# Patient Record
Sex: Female | Born: 1970 | Race: White | Hispanic: No | Marital: Married | Smoking: Never smoker
Health system: Southern US, Community
[De-identification: ages and names within clinical notes are randomized; demographics above are authoritative.]

## PROBLEM LIST (undated history)

## (undated) DIAGNOSIS — F419 Anxiety disorder, unspecified: Secondary | ICD-10-CM

## (undated) DIAGNOSIS — E785 Hyperlipidemia, unspecified: Secondary | ICD-10-CM

## (undated) DIAGNOSIS — T7840XA Allergy, unspecified, initial encounter: Secondary | ICD-10-CM

## (undated) DIAGNOSIS — Z789 Other specified health status: Secondary | ICD-10-CM

## (undated) HISTORY — PX: DILATION AND CURETTAGE OF UTERUS: SHX78

## (undated) HISTORY — DX: Hyperlipidemia, unspecified: E78.5

## (undated) HISTORY — DX: Anxiety disorder, unspecified: F41.9

## (undated) HISTORY — DX: Allergy, unspecified, initial encounter: T78.40XA

## (undated) HISTORY — PX: BREAST BIOPSY: SHX20

---

## 2006-09-09 HISTORY — PX: WRIST GANGLION EXCISION: SHX840

## 2009-05-19 ENCOUNTER — Inpatient Hospital Stay (HOSPITAL_COMMUNITY): Admission: RE | Admit: 2009-05-19 | Discharge: 2009-05-22 | Payer: Self-pay | Admitting: Obstetrics and Gynecology

## 2010-09-15 ENCOUNTER — Ambulatory Visit (HOSPITAL_COMMUNITY)
Admission: RE | Admit: 2010-09-15 | Discharge: 2010-09-15 | Payer: Self-pay | Source: Home / Self Care | Attending: Obstetrics and Gynecology | Admitting: Obstetrics and Gynecology

## 2010-09-24 LAB — CBC
HCT: 39.7 % (ref 36.0–46.0)
Hemoglobin: 13.7 g/dL (ref 12.0–15.0)
MCH: 32.5 pg (ref 26.0–34.0)
MCHC: 34.5 g/dL (ref 30.0–36.0)
MCV: 94.1 fL (ref 78.0–100.0)
Platelets: 248 10*3/uL (ref 150–400)
RBC: 4.22 MIL/uL (ref 3.87–5.11)
RDW: 13 % (ref 11.5–15.5)
WBC: 5.2 10*3/uL (ref 4.0–10.5)

## 2010-10-10 DEATH — deceased

## 2010-12-14 LAB — CBC
HCT: 30.5 % — ABNORMAL LOW (ref 36.0–46.0)
HCT: 39.2 % (ref 36.0–46.0)
Hemoglobin: 10.5 g/dL — ABNORMAL LOW (ref 12.0–15.0)
Hemoglobin: 13.4 g/dL (ref 12.0–15.0)
MCHC: 34.3 g/dL (ref 30.0–36.0)
MCHC: 34.5 g/dL (ref 30.0–36.0)
MCV: 100.5 fL — ABNORMAL HIGH (ref 78.0–100.0)
MCV: 99.5 fL (ref 78.0–100.0)
Platelets: 166 10*3/uL (ref 150–400)
Platelets: 198 10*3/uL (ref 150–400)
RBC: 3.03 MIL/uL — ABNORMAL LOW (ref 3.87–5.11)
RBC: 3.94 MIL/uL (ref 3.87–5.11)
RDW: 13.6 % (ref 11.5–15.5)
RDW: 14.3 % (ref 11.5–15.5)
WBC: 10.2 10*3/uL (ref 4.0–10.5)
WBC: 8.1 10*3/uL (ref 4.0–10.5)

## 2010-12-14 LAB — RPR: RPR Ser Ql: NONREACTIVE

## 2012-07-14 LAB — OB RESULTS CONSOLE ABO/RH: RH Type: POSITIVE

## 2012-07-14 LAB — OB RESULTS CONSOLE HEPATITIS B SURFACE ANTIGEN: Hepatitis B Surface Ag: NEGATIVE

## 2012-07-14 LAB — OB RESULTS CONSOLE RUBELLA ANTIBODY, IGM: Rubella: IMMUNE

## 2012-07-14 LAB — OB RESULTS CONSOLE ANTIBODY SCREEN: Antibody Screen: NEGATIVE

## 2012-07-14 LAB — OB RESULTS CONSOLE HIV ANTIBODY (ROUTINE TESTING): HIV: NONREACTIVE

## 2012-09-09 HISTORY — PX: TUBAL LIGATION: SHX77

## 2013-01-29 ENCOUNTER — Encounter (HOSPITAL_COMMUNITY): Payer: Self-pay

## 2013-02-08 NOTE — H&P (Addendum)
Amanda Bush is a 42 y.o. female presenting for repeat C/S and BTL  Previous C/S desires repeat.  Pregnancy complicated by AMA with normal [place] and MateriT21 . History OB History   No data available     No past medical history on file. No past surgical history on file. Family History: family history is not on file. Social History:  has no tobacco, alcohol, and drug history on file.   Prenatal Transfer Tool  Maternal Diabetes: No Genetic Screening: Normal Maternal Ultrasounds/Referrals: Normal Fetal Ultrasounds or other Referrals:  None Maternal Substance Abuse:  No Significant Maternal Medications:  None Significant Maternal Lab Results:  None Other Comments:  None  ROS    There were no vitals taken for this visit. Exam Physical Exam  Cx Cl /50%/High Prenatal labs: ABO, Rh:   Antibody:   Rubella:   RPR:    HBsAg:    HIV:    GBS:     Assessment/Plan: IUP at 39 weeks Prev C/S desires repeat Risks and benefits of C/S were discussed.  All questions were answered and informed consent was obtained.  Plan to proceed with low segment transverse Cesarean Section. Multiparity and desires sterility.  Discussed risks and benefits of sterilization including but not limited to risk of tubal failure quoted as 01/999.  She gives her informed consent.   Amanda Bush C 02/08/2013, 9:39 AM    This patient has been seen and examined.   All of her questions were answered.  Labs and vital signs reviewed.  Informed consent has been obtained.  The History and Physical is current. 02/11/13 0715 DL

## 2013-02-09 ENCOUNTER — Encounter (HOSPITAL_COMMUNITY)
Admission: RE | Admit: 2013-02-09 | Discharge: 2013-02-09 | Disposition: A | Discharge status: Home / Self Care | Payer: Managed Care, Other (non HMO) | Source: Ambulatory Visit | Attending: Obstetrics and Gynecology | Admitting: Obstetrics and Gynecology

## 2013-02-09 ENCOUNTER — Encounter (HOSPITAL_COMMUNITY): Payer: Self-pay

## 2013-02-09 HISTORY — DX: Other specified health status: Z78.9

## 2013-02-09 LAB — CBC
HCT: 39.8 % (ref 36.0–46.0)
Hemoglobin: 13.5 g/dL (ref 12.0–15.0)
MCH: 33.3 pg (ref 26.0–34.0)
MCHC: 33.9 g/dL (ref 30.0–36.0)
MCV: 98 fL (ref 78.0–100.0)
Platelets: 164 10*3/uL (ref 150–400)
RBC: 4.06 MIL/uL (ref 3.87–5.11)
RDW: 14.6 % (ref 11.5–15.5)
WBC: 8.1 10*3/uL (ref 4.0–10.5)

## 2013-02-09 LAB — TYPE AND SCREEN
ABO/RH(D): A POS
Antibody Screen: NEGATIVE

## 2013-02-09 LAB — ABO/RH: ABO/RH(D): A POS

## 2013-02-09 NOTE — Patient Instructions (Addendum)
20 SUHANI STILLION  02/09/2013   Your procedure is scheduled on:  02/11/13  Enter through the Main Entrance of [facility] at 6 AM.  Pick up the phone at the desk and dial [phone].   Call this number if you have problems the morning of surgery: [phone]   Remember:   Do not eat food:After Midnight.  Do not drink clear liquids: After Midnight.  Take these medicines the morning of surgery with A SIP OF WATER: NA   Do not wear jewelry, make-up or nail polish.  Do not wear lotions, powders, or perfumes. You may wear deodorant.  Do not shave 48 hours prior to surgery.  Do not bring valuables to the hospital.  [facility] is not responsible                  for any belongings or valuables brought to the hospital.  Contacts, dentures or bridgework may not be worn into surgery.  Leave suitcase in the car. After surgery it may be brought to your room.  For patients admitted to the hospital, checkout time is 11:00 AM the day of                discharge.   Patients discharged the day of surgery will not be allowed to drive                   home.  Name and phone number of your driver: NA  Special Instructions: Shower using CHG 2 nights before surgery and the night before surgery.  If you shower the day of surgery use CHG.  Use special wash - you have one bottle of CHG for all showers.  You should use approximately 1/3 of the bottle for each shower.   Please read over the following fact sheets that you were given: Surgical Site Infection Prevention

## 2013-02-10 LAB — RPR: RPR Ser Ql: NONREACTIVE

## 2013-02-10 MED ORDER — DEXTROSE 5 % IV SOLN
2.0000 g | INTRAVENOUS | Status: AC
  Administered 2013-02-11: 2 g via INTRAVENOUS
  Filled 2013-02-10: qty 2

## 2013-02-11 ENCOUNTER — Encounter (HOSPITAL_COMMUNITY): Payer: Self-pay | Admitting: *Deleted

## 2013-02-11 ENCOUNTER — Inpatient Hospital Stay (HOSPITAL_COMMUNITY): Payer: Managed Care, Other (non HMO) | Admitting: Anesthesiology

## 2013-02-11 ENCOUNTER — Encounter (HOSPITAL_COMMUNITY): Payer: Self-pay | Admitting: Anesthesiology

## 2013-02-11 ENCOUNTER — Encounter (HOSPITAL_COMMUNITY)
Admission: RE | Disposition: A | Discharge status: Home / Self Care | Payer: Self-pay | Source: Ambulatory Visit | Attending: Obstetrics and Gynecology

## 2013-02-11 ENCOUNTER — Inpatient Hospital Stay (HOSPITAL_COMMUNITY)
Admission: RE | Admit: 2013-02-11 | Discharge: 2013-02-13 | DRG: 766 | Disposition: A | Discharge status: Home / Self Care | Payer: Managed Care, Other (non HMO) | Source: Ambulatory Visit | Attending: Obstetrics and Gynecology | Admitting: Obstetrics and Gynecology

## 2013-02-11 DIAGNOSIS — Z302 Encounter for sterilization: Secondary | ICD-10-CM

## 2013-02-11 DIAGNOSIS — O34219 Maternal care for unspecified type scar from previous cesarean delivery: Principal | ICD-10-CM | POA: Diagnosis present

## 2013-02-11 DIAGNOSIS — O09529 Supervision of elderly multigravida, unspecified trimester: Secondary | ICD-10-CM | POA: Diagnosis present

## 2013-02-11 LAB — TYPE AND SCREEN: ABO/RH(D): A POS

## 2013-02-11 SURGERY — Surgical Case
Anesthesia: Spinal | Laterality: Bilateral | Wound class: Clean Contaminated

## 2013-02-11 MED ORDER — MENTHOL 3 MG MT LOZG: 1.0000 | LOZENGE | OROMUCOSAL | Status: DC | PRN

## 2013-02-11 MED ORDER — DIPHENHYDRAMINE HCL 25 MG PO CAPS: 25.0000 mg | ORAL_CAPSULE | Freq: Four times a day (QID) | ORAL | Status: DC | PRN

## 2013-02-11 MED ORDER — FENTANYL CITRATE 0.05 MG/ML IJ SOLN
INTRAMUSCULAR | Status: AC
  Filled 2013-02-11: qty 2

## 2013-02-11 MED ORDER — MEPERIDINE HCL 25 MG/ML IJ SOLN: 6.2500 mg | INTRAMUSCULAR | Status: DC | PRN

## 2013-02-11 MED ORDER — LANOLIN HYDROUS EX OINT: 1.0000 "application " | TOPICAL_OINTMENT | CUTANEOUS | Status: DC | PRN

## 2013-02-11 MED ORDER — LACTATED RINGERS IV SOLN
Freq: Once | INTRAVENOUS | Status: AC
  Administered 2013-02-11: 1000 mL via INTRAVENOUS

## 2013-02-11 MED ORDER — DIBUCAINE 1 % RE OINT: 1.0000 "application " | TOPICAL_OINTMENT | RECTAL | Status: DC | PRN

## 2013-02-11 MED ORDER — SENNOSIDES-DOCUSATE SODIUM 8.6-50 MG PO TABS
2.0000 | ORAL_TABLET | Freq: Every day | ORAL | Status: DC
  Administered 2013-02-11 – 2013-02-12 (×2): 2 via ORAL

## 2013-02-11 MED ORDER — METOCLOPRAMIDE HCL 5 MG/ML IJ SOLN: 10.0000 mg | Freq: Three times a day (TID) | INTRAMUSCULAR | Status: DC | PRN

## 2013-02-11 MED ORDER — NALBUPHINE HCL 10 MG/ML IJ SOLN
5.0000 mg | INTRAMUSCULAR | Status: DC | PRN
  Filled 2013-02-11: qty 1

## 2013-02-11 MED ORDER — EPHEDRINE SULFATE 50 MG/ML IJ SOLN
INTRAMUSCULAR | Status: DC | PRN
  Administered 2013-02-11 (×3): 10 mg via INTRAVENOUS

## 2013-02-11 MED ORDER — SCOPOLAMINE 1 MG/3DAYS TD PT72
MEDICATED_PATCH | TRANSDERMAL | Status: AC
  Filled 2013-02-11: qty 1

## 2013-02-11 MED ORDER — DIPHENHYDRAMINE HCL 50 MG/ML IJ SOLN: 12.5000 mg | INTRAMUSCULAR | Status: DC | PRN

## 2013-02-11 MED ORDER — OXYCODONE-ACETAMINOPHEN 5-325 MG PO TABS
1.0000 | ORAL_TABLET | ORAL | Status: DC | PRN
  Administered 2013-02-12: 0.5 via ORAL
  Filled 2013-02-11 (×2): qty 1

## 2013-02-11 MED ORDER — LACTATED RINGERS IV SOLN
INTRAVENOUS | Status: DC | PRN
  Administered 2013-02-11: 08:00:00 via INTRAVENOUS

## 2013-02-11 MED ORDER — NALOXONE HCL 0.4 MG/ML IJ SOLN: 0.4000 mg | INTRAMUSCULAR | Status: DC | PRN

## 2013-02-11 MED ORDER — PRENATAL MULTIVITAMIN CH
1.0000 | ORAL_TABLET | Freq: Every day | ORAL | Status: DC
  Administered 2013-02-11 – 2013-02-12 (×2): 1 via ORAL
  Filled 2013-02-11 (×2): qty 1

## 2013-02-11 MED ORDER — ONDANSETRON HCL 4 MG/2ML IJ SOLN
INTRAMUSCULAR | Status: DC | PRN
  Administered 2013-02-11: 4 mg via INTRAVENOUS

## 2013-02-11 MED ORDER — NALOXONE HCL 1 MG/ML IJ SOLN
1.0000 ug/kg/h | INTRAVENOUS | Status: DC | PRN
  Filled 2013-02-11: qty 2

## 2013-02-11 MED ORDER — BUPIVACAINE IN DEXTROSE 0.75-8.25 % IT SOLN
INTRATHECAL | Status: DC | PRN
  Administered 2013-02-11: 1.5 mL via INTRATHECAL

## 2013-02-11 MED ORDER — FENTANYL CITRATE 0.05 MG/ML IJ SOLN: 25.0000 ug | INTRAMUSCULAR | Status: DC | PRN

## 2013-02-11 MED ORDER — SODIUM CHLORIDE 0.9 % IJ SOLN: 3.0000 mL | INTRAMUSCULAR | Status: DC | PRN

## 2013-02-11 MED ORDER — KETOROLAC TROMETHAMINE 30 MG/ML IJ SOLN
30.0000 mg | Freq: Four times a day (QID) | INTRAMUSCULAR | Status: AC | PRN
  Administered 2013-02-11: 30 mg via INTRAVENOUS
  Filled 2013-02-11: qty 1

## 2013-02-11 MED ORDER — LACTATED RINGERS IV SOLN: INTRAVENOUS | Status: DC

## 2013-02-11 MED ORDER — ONDANSETRON HCL 4 MG PO TABS: 4.0000 mg | ORAL_TABLET | ORAL | Status: DC | PRN

## 2013-02-11 MED ORDER — LACTATED RINGERS IV SOLN
INTRAVENOUS | Status: DC
  Administered 2013-02-11: 14:00:00 via INTRAVENOUS

## 2013-02-11 MED ORDER — KETOROLAC TROMETHAMINE 30 MG/ML IJ SOLN: 30.0000 mg | Freq: Four times a day (QID) | INTRAMUSCULAR | Status: AC | PRN

## 2013-02-11 MED ORDER — EPHEDRINE 5 MG/ML INJ
INTRAVENOUS | Status: AC
  Filled 2013-02-11: qty 10

## 2013-02-11 MED ORDER — SCOPOLAMINE 1 MG/3DAYS TD PT72
1.0000 | MEDICATED_PATCH | Freq: Once | TRANSDERMAL | Status: DC
  Administered 2013-02-11: 1.5 mg via TRANSDERMAL

## 2013-02-11 MED ORDER — LACTATED RINGERS IV SOLN
INTRAVENOUS | Status: DC | PRN
  Administered 2013-02-11 (×2): via INTRAVENOUS

## 2013-02-11 MED ORDER — ONDANSETRON HCL 4 MG/2ML IJ SOLN
INTRAMUSCULAR | Status: AC
  Filled 2013-02-11: qty 2

## 2013-02-11 MED ORDER — ONDANSETRON HCL 4 MG/2ML IJ SOLN: 4.0000 mg | INTRAMUSCULAR | Status: DC | PRN

## 2013-02-11 MED ORDER — SCOPOLAMINE 1 MG/3DAYS TD PT72: 1.0000 | MEDICATED_PATCH | Freq: Once | TRANSDERMAL | Status: DC

## 2013-02-11 MED ORDER — PROMETHAZINE HCL 25 MG/ML IJ SOLN: 6.2500 mg | INTRAMUSCULAR | Status: DC | PRN

## 2013-02-11 MED ORDER — DIPHENHYDRAMINE HCL 25 MG PO CAPS
25.0000 mg | ORAL_CAPSULE | ORAL | Status: DC | PRN
  Filled 2013-02-11: qty 1

## 2013-02-11 MED ORDER — OXYTOCIN 10 UNIT/ML IJ SOLN
INTRAMUSCULAR | Status: AC
  Filled 2013-02-11: qty 4

## 2013-02-11 MED ORDER — PHENYLEPHRINE HCL 10 MG/ML IJ SOLN
INTRAMUSCULAR | Status: DC | PRN
  Administered 2013-02-11: 80 ug via INTRAVENOUS

## 2013-02-11 MED ORDER — OXYTOCIN 10 UNIT/ML IJ SOLN
40.0000 [IU] | INTRAVENOUS | Status: DC | PRN
  Administered 2013-02-11: 40 [IU] via INTRAVENOUS

## 2013-02-11 MED ORDER — OXYTOCIN 40 UNITS IN LACTATED RINGERS INFUSION - SIMPLE MED: 62.5000 mL/h | INTRAVENOUS | Status: AC

## 2013-02-11 MED ORDER — DIPHENHYDRAMINE HCL 50 MG/ML IJ SOLN: 25.0000 mg | INTRAMUSCULAR | Status: DC | PRN

## 2013-02-11 MED ORDER — WITCH HAZEL-GLYCERIN EX PADS: 1.0000 "application " | MEDICATED_PAD | CUTANEOUS | Status: DC | PRN

## 2013-02-11 MED ORDER — SIMETHICONE 80 MG PO CHEW: 80.0000 mg | CHEWABLE_TABLET | ORAL | Status: DC | PRN

## 2013-02-11 MED ORDER — KETOROLAC TROMETHAMINE 60 MG/2ML IM SOLN
INTRAMUSCULAR | Status: AC
  Administered 2013-02-11: 60 mg via INTRAMUSCULAR
  Filled 2013-02-11: qty 2

## 2013-02-11 MED ORDER — FENTANYL CITRATE 0.05 MG/ML IJ SOLN
INTRAMUSCULAR | Status: DC | PRN
  Administered 2013-02-11: 25 ug via INTRATHECAL

## 2013-02-11 MED ORDER — MORPHINE SULFATE 0.5 MG/ML IJ SOLN
INTRAMUSCULAR | Status: AC
  Filled 2013-02-11: qty 10

## 2013-02-11 MED ORDER — KETOROLAC TROMETHAMINE 60 MG/2ML IM SOLN: 60.0000 mg | Freq: Once | INTRAMUSCULAR | Status: AC | PRN

## 2013-02-11 MED ORDER — PHENYLEPHRINE 40 MCG/ML (10ML) SYRINGE FOR IV PUSH (FOR BLOOD PRESSURE SUPPORT)
PREFILLED_SYRINGE | INTRAVENOUS | Status: AC
  Filled 2013-02-11: qty 5

## 2013-02-11 MED ORDER — TETANUS-DIPHTH-ACELL PERTUSSIS 5-2.5-18.5 LF-MCG/0.5 IM SUSP: 0.5000 mL | Freq: Once | INTRAMUSCULAR | Status: DC

## 2013-02-11 MED ORDER — ZOLPIDEM TARTRATE 5 MG PO TABS: 5.0000 mg | ORAL_TABLET | Freq: Every evening | ORAL | Status: DC | PRN

## 2013-02-11 MED ORDER — IBUPROFEN 600 MG PO TABS
600.0000 mg | ORAL_TABLET | Freq: Four times a day (QID) | ORAL | Status: DC
  Administered 2013-02-11 – 2013-02-13 (×6): 600 mg via ORAL
  Filled 2013-02-11 (×6): qty 1

## 2013-02-11 MED ORDER — MORPHINE SULFATE (PF) 0.5 MG/ML IJ SOLN
INTRAMUSCULAR | Status: DC | PRN
  Administered 2013-02-11: .15 mg via INTRATHECAL

## 2013-02-11 MED ORDER — SIMETHICONE 80 MG PO CHEW
80.0000 mg | CHEWABLE_TABLET | Freq: Three times a day (TID) | ORAL | Status: DC
  Administered 2013-02-11 – 2013-02-12 (×6): 80 mg via ORAL

## 2013-02-11 MED ORDER — ONDANSETRON HCL 4 MG/2ML IJ SOLN: 4.0000 mg | Freq: Three times a day (TID) | INTRAMUSCULAR | Status: DC | PRN

## 2013-02-11 SURGICAL SUPPLY — 26 items
CLAMP CORD UMBIL (MISCELLANEOUS) IMPLANT
CLIP FILSHIE TUBAL LIGA STRL (Clip) ×2 IMPLANT
CLOTH BEACON ORANGE TIMEOUT ST (SAFETY) ×2 IMPLANT
DRAPE LG THREE QUARTER DISP (DRAPES) ×2 IMPLANT
DRSG OPSITE POSTOP 4X10 (GAUZE/BANDAGES/DRESSINGS) ×2 IMPLANT
DURAPREP 26ML APPLICATOR (WOUND CARE) ×2 IMPLANT
ELECT REM PT RETURN 9FT ADLT (ELECTROSURGICAL) ×2
ELECTRODE REM PT RTRN 9FT ADLT (ELECTROSURGICAL) ×1 IMPLANT
EXTRACTOR VACUUM M CUP 4 TUBE (SUCTIONS) IMPLANT
GLOVE SURG ORTHO 8.0 STRL STRW (GLOVE) ×2 IMPLANT
GOWN STRL REIN XL XLG (GOWN DISPOSABLE) ×4 IMPLANT
KIT ABG SYR 3ML LUER SLIP (SYRINGE) ×2 IMPLANT
NEEDLE HYPO 25X5/8 SAFETYGLIDE (NEEDLE) ×2 IMPLANT
NS IRRIG 1000ML POUR BTL (IV SOLUTION) ×2 IMPLANT
PACK C SECTION WH (CUSTOM PROCEDURE TRAY) ×2 IMPLANT
PAD OB MATERNITY 4.3X12.25 (PERSONAL CARE ITEMS) ×2 IMPLANT
STAPLER VISISTAT 35W (STAPLE) IMPLANT
SUT MNCRL 0 VIOLET CTX 36 (SUTURE) ×3 IMPLANT
SUT MONOCRYL 0 CTX 36 (SUTURE) ×3
SUT PDS AB 1 CT  36 (SUTURE)
SUT PDS AB 1 CT 36 (SUTURE) IMPLANT
SUT VIC AB 1 CTX 36 (SUTURE)
SUT VIC AB 1 CTX36XBRD ANBCTRL (SUTURE) IMPLANT
TOWEL OR 17X24 6PK STRL BLUE (TOWEL DISPOSABLE) ×6 IMPLANT
TRAY FOLEY CATH 14FR (SET/KITS/TRAYS/PACK) ×2 IMPLANT
WATER STERILE IRR 1000ML POUR (IV SOLUTION) ×2 IMPLANT

## 2013-02-11 NOTE — Transfer of Care (Signed)
Immediate Anesthesia Transfer of Care Note  Patient: Amanda Bush  Procedure(s) Performed: Procedure(s): REPEAT CESAREAN SECTION WITH BILATERAL TUBAL LIGATION (Bilateral)  Patient Location: PACU  Anesthesia Type:Spinal  Level of Consciousness: awake, alert  and oriented  Airway & Oxygen Therapy: Patient Spontanous Breathing  Post-op Assessment: Report given to PACU RN and Post -op Vital signs reviewed and stable  Post vital signs: Reviewed and stable  Complications: No apparent anesthesia complications

## 2013-02-11 NOTE — Anesthesia Postprocedure Evaluation (Signed)
  Anesthesia Post-op Note  Patient: Amanda Bush  Procedure(s) Performed: Procedure(s): REPEAT CESAREAN SECTION WITH BILATERAL TUBAL LIGATION (Bilateral)  Patient Location: PACU and Mother/Baby  Anesthesia Type:Spinal  Level of Consciousness: awake, alert , oriented and patient cooperative  Airway and Oxygen Therapy: Patient Spontanous Breathing  Post-op Pain: none  Post-op Assessment: Post-op Vital signs reviewed, No signs of Nausea or vomiting, Adequate PO intake, Pain level controlled, No headache, No backache, No residual numbness and No residual motor weakness  Post-op Vital Signs: Reviewed and stable  Complications: No apparent anesthesia complications

## 2013-02-11 NOTE — Anesthesia Postprocedure Evaluation (Signed)
  Anesthesia Post Note  Patient: Amanda Bush  Procedure(s) Performed: Procedure(s) (LRB): REPEAT CESAREAN SECTION WITH BILATERAL TUBAL LIGATION (Bilateral)  Anesthesia type: Spinal  Patient location: PACU  Post pain: Pain level controlled  Post assessment: Post-op Vital signs reviewed  Last Vitals:  Filed Vitals:   02/11/13 0945  BP: 113/69  Pulse: 71  Temp:   Resp: 17    Post vital signs: Reviewed  Level of consciousness: awake  Complications: No apparent anesthesia complications

## 2013-02-11 NOTE — Anesthesia Procedure Notes (Signed)
Spinal  Patient location during procedure: OR Start time: 02/11/2013 7:37 AM Staffing Anesthesiologist: Angus Seller., Harrell Gave. Performed by: anesthesiologist  Preanesthetic Checklist Completed: patient identified, site marked, surgical consent, pre-op evaluation, timeout performed, IV checked, risks and benefits discussed and monitors and equipment checked Spinal Block Patient position: sitting Prep: DuraPrep Patient monitoring: heart rate, cardiac monitor, continuous pulse ox and blood pressure Approach: midline Location: L3-4 Injection technique: single-shot Needle Needle type: Sprotte  Needle gauge: 24 G Needle length: 9 cm Assessment Sensory level: T4 Additional Notes Patient identified.  Risk benefits discussed including failed block, incomplete pain control, headache, nerve damage, paralysis, blood pressure changes, nausea, vomiting, reactions to medication both toxic or allergic, and postpartum back pain.  Patient expressed understanding and wished to proceed.  All questions were answered.  Sterile technique used throughout procedure.  CSF was clear.  No parasthesia or other complications.  Please see nursing notes for vital signs.

## 2013-02-11 NOTE — Anesthesia Preprocedure Evaluation (Addendum)
Anesthesia Evaluation  Patient identified by MRN, date of birth, ID band Patient awake    Reviewed: Allergy & Precautions, H&P , NPO status , Patient's Chart, lab work & pertinent test results, reviewed documented beta blocker date and time   Airway Mallampati: II TM Distance: >3 FB Neck ROM: full    Dental no notable dental hx.    Pulmonary neg pulmonary ROS,  breath sounds clear to auscultation  Pulmonary exam normal       Cardiovascular Exercise Tolerance: Good negative cardio ROS  Rhythm:regular Rate:Normal     Neuro/Psych negative neurological ROS  negative psych ROS   GI/Hepatic negative GI ROS, Neg liver ROS,   Endo/Other  negative endocrine ROS  Renal/GU negative Renal ROS  negative genitourinary   Musculoskeletal   Abdominal   Peds  Hematology negative hematology ROS (+)   Anesthesia Other Findings Upper front right veneer  Reproductive/Obstetrics negative OB ROS (+) Pregnancy                           Anesthesia Physical Anesthesia Plan  ASA: II  Anesthesia Plan: Spinal   Post-op Pain Management:    Induction:   Airway Management Planned:   Additional Equipment:   Intra-op Plan:   Post-operative Plan:   Informed Consent: I have reviewed the patients History and Physical, chart, labs and discussed the procedure including the risks, benefits and alternatives for the proposed anesthesia with the patient or authorized representative who has indicated his/her understanding and acceptance.   Dental Advisory Given  Plan Discussed with: CRNA  Anesthesia Plan Comments:         Anesthesia Quick Evaluation

## 2013-02-11 NOTE — Progress Notes (Signed)
Pt feet tingling, slight dizziness when sitting, did not attempt to ambulate at this time. Will try again later.

## 2013-02-11 NOTE — Op Note (Signed)
Cesarean Section Procedure Note  Pre-operative Diagnosis: IUP at 39 weeks, prev C/S desires repeat, Multiparity and desires sterility  Post-operative Diagnosis: same  Surgeon: Turner Daniels   Assistants: none  Anesthesia:spinal  Procedure:  Low Segment Transverse cesarean section and BTL with filshie clips  Procedure Details  The patient was seen in the Holding Room. The risks, benefits, complications, treatment options, and expected outcomes were discussed with the patient.  The patient concurred with the proposed plan, giving informed consent.  The site of surgery properly noted/marked.. A Time Out was held and the above information confirmed.  After induction of anesthesia, the patient was draped and prepped in the usual sterile manner. A Pfannenstiel incision was made and carried down through the subcutaneous tissue to the fascia. Fascial incision was made and extended transversely. The fascia was separated from the underlying rectus tissue superiorly and inferiorly. The peritoneum was identified and entered. Peritoneal incision was extended longitudinally. The utero-vesical peritoneal reflection was incised transversely and the bladder flap was bluntly freed from the lower uterine segment. A low transverse uterine incision was made. Delivered from vertex presentation was a baby with Apgar scores of 8 at one minute and 8 at five minutes. After the umbilical cord was clamped and cut cord blood was obtained for evaluation. The placenta was removed intact and appeared normal. The uterine outline, tubes and ovaries appeared normal. The uterine incision was closed with running locked sutures of 0 monocryl and imbricated with 0 monocryl. Hemostasis was observed.The fallopian tubes were identified by their fimbriated ends and filshie clips were placed across the tubes at the midportion of each tube.  Good placement was noted bilaterally.  Lavage was carried out until clear. The peritoneum was then  closed with 0 monocryl and rectus muscles plicated in the midline.  After hemostasis was assured, the fascia was then reapproximated with running sutures of 0 Vicryl. Irrigation was applied and after adequate hemostasis was assured, the skin was reapproximated with staples.  Instrument, sponge, and needle counts were correct prior the abdominal closure and at the conclusion of the case. The patient received 2 grams cefotetan preoperatively.  Findings: Viable female, normal anatomy  Estimated Blood Loss:  600cc         Specimens: Placenta was sent to L&D         Complications:  None

## 2013-02-12 ENCOUNTER — Encounter (HOSPITAL_COMMUNITY): Payer: Self-pay | Admitting: Obstetrics and Gynecology

## 2013-02-12 LAB — CBC
Platelets: 133 10*3/uL — ABNORMAL LOW (ref 150–400)
RDW: 15 % (ref 11.5–15.5)
WBC: 9.7 10*3/uL (ref 4.0–10.5)

## 2013-02-12 NOTE — Progress Notes (Signed)
Subjective: Postpartum Day 1: Cesarean Delivery Patient reports tolerating PO, + flatus and no problems voiding.    Objective: Vital signs in last 24 hours: Temp:  [97.7 F (36.5 C)-98.5 F (36.9 C)] 98.2 F (36.8 C) (06/06 0515) Pulse Rate:  [63-89] 77 (06/06 0515) Resp:  [17-20] 18 (06/06 0515) BP: (97-120)/(46-80) 110/59 mmHg (06/06 0515) SpO2:  [93 %-100 %] 96 % (06/06 0515) Weight:  [151 lb (68.493 kg)] 151 lb (68.493 kg) (06/05 0830)  Physical Exam:  General: alert and cooperative Lochia: appropriate Uterine Fundus: firm Incision: honeycomb dressing CDI DVT Evaluation: No evidence of DVT seen on physical exam. Negative Homan's sign. No cords or calf tenderness. No significant calf/ankle edema.   Recent Labs  02/09/13 1400 02/12/13 0600  HGB 13.5 11.4*  HCT 39.8 34.0*    Assessment/Plan: Status post Cesarean section. Doing well postoperatively.  Desires discharge in am.  Alica Shellhammer G 02/12/2013, 7:53 AM

## 2013-02-13 MED ORDER — OXYCODONE-ACETAMINOPHEN 5-325 MG PO TABS: 1.0000 | ORAL_TABLET | ORAL | Status: DC | PRN

## 2013-02-13 MED ORDER — IBUPROFEN 600 MG PO TABS: 600.0000 mg | ORAL_TABLET | Freq: Four times a day (QID) | ORAL | Status: DC

## 2013-02-13 NOTE — Discharge Summary (Signed)
Obstetric Discharge Summary Reason for Admission: cesarean section Prenatal Procedures: ultrasound Intrapartum Procedures: cesarean: low cervical, vertical and tubal ligation Postpartum Procedures: none Complications-Operative and Postpartum: none Hemoglobin  Date Value Range Status  02/12/2013 11.4* 12.0 - 15.0 g/dL Final     HCT  Date Value Range Status  02/12/2013 34.0* 36.0 - 46.0 % Final    Physical Exam:  General: alert and cooperative Lochia: appropriate Uterine Fundus: firm Incision: healing well DVT Evaluation: No evidence of DVT seen on physical exam.  Discharge Diagnoses: Term Pregnancy-delivered  Discharge Information: Date: 02/13/2013 Activity: pelvic rest Diet: routine Medications: PNV, Ibuprofen and Percocet Condition: stable Instructions: refer to practice specific booklet Discharge to: home Follow-up Information   Schedule an appointment as soon as possible for a visit in 1 week to follow up.      Newborn Data: Live born female  Birth Weight: 8 lb 5 oz (3770 g) APGAR: 8, 8  Home with mother.  Amanda Bush 02/13/2013, 7:12 AM

## 2013-02-18 ENCOUNTER — Inpatient Hospital Stay (HOSPITAL_COMMUNITY): Admission: AD | Admit: 2013-02-18 | Payer: Self-pay | Source: Ambulatory Visit | Admitting: Obstetrics and Gynecology

## 2014-04-14 ENCOUNTER — Ambulatory Visit: Payer: BC Managed Care – PPO | Admitting: Neurology

## 2014-07-11 ENCOUNTER — Encounter (HOSPITAL_COMMUNITY): Payer: Self-pay | Admitting: Obstetrics and Gynecology

## 2014-11-16 ENCOUNTER — Other Ambulatory Visit: Payer: Self-pay | Admitting: Obstetrics and Gynecology

## 2015-04-06 ENCOUNTER — Other Ambulatory Visit: Payer: Self-pay | Admitting: Obstetrics and Gynecology

## 2015-04-07 LAB — CYTOLOGY - PAP

## 2017-04-30 ENCOUNTER — Other Ambulatory Visit: Payer: Self-pay | Admitting: Obstetrics and Gynecology

## 2017-04-30 DIAGNOSIS — R928 Other abnormal and inconclusive findings on diagnostic imaging of breast: Secondary | ICD-10-CM

## 2017-05-02 ENCOUNTER — Other Ambulatory Visit: Payer: Self-pay | Admitting: Obstetrics and Gynecology

## 2017-05-02 ENCOUNTER — Ambulatory Visit
Admission: RE | Admit: 2017-05-02 | Discharge: 2017-05-02 | Disposition: A | Discharge status: Home / Self Care | Payer: Managed Care, Other (non HMO) | Source: Ambulatory Visit | Attending: Obstetrics and Gynecology | Admitting: Obstetrics and Gynecology

## 2017-05-02 DIAGNOSIS — R928 Other abnormal and inconclusive findings on diagnostic imaging of breast: Secondary | ICD-10-CM

## 2017-05-02 DIAGNOSIS — N6320 Unspecified lump in the left breast, unspecified quadrant: Secondary | ICD-10-CM

## 2017-05-05 ENCOUNTER — Other Ambulatory Visit: Payer: Managed Care, Other (non HMO)

## 2017-05-05 ENCOUNTER — Ambulatory Visit
Admission: RE | Admit: 2017-05-05 | Discharge: 2017-05-05 | Disposition: A | Discharge status: Home / Self Care | Payer: Managed Care, Other (non HMO) | Source: Ambulatory Visit | Attending: Obstetrics and Gynecology | Admitting: Obstetrics and Gynecology

## 2017-05-05 ENCOUNTER — Other Ambulatory Visit: Payer: Self-pay | Admitting: Obstetrics and Gynecology

## 2017-05-05 DIAGNOSIS — N6320 Unspecified lump in the left breast, unspecified quadrant: Secondary | ICD-10-CM

## 2017-07-30 DIAGNOSIS — L509 Urticaria, unspecified: Secondary | ICD-10-CM | POA: Insufficient documentation

## 2018-01-21 ENCOUNTER — Other Ambulatory Visit: Payer: Self-pay | Admitting: Obstetrics and Gynecology

## 2018-01-21 DIAGNOSIS — Z1231 Encounter for screening mammogram for malignant neoplasm of breast: Secondary | ICD-10-CM

## 2018-04-29 ENCOUNTER — Ambulatory Visit
Admission: RE | Admit: 2018-04-29 | Discharge: 2018-04-29 | Disposition: A | Discharge status: Home / Self Care | Payer: Managed Care, Other (non HMO) | Source: Ambulatory Visit | Attending: Obstetrics and Gynecology | Admitting: Obstetrics and Gynecology

## 2018-04-29 DIAGNOSIS — Z1231 Encounter for screening mammogram for malignant neoplasm of breast: Secondary | ICD-10-CM

## 2018-07-16 DIAGNOSIS — R300 Dysuria: Secondary | ICD-10-CM | POA: Insufficient documentation

## 2018-08-20 IMAGING — US US BREAST BX W LOC DEV 1ST LESION IMG BX SPEC US GUIDE*L*
1 series · 11 of 11 positions shown · non-contrast
Comparison: Previous exam(s).

ADDENDUM:
Pathology revealed FIBROADENOMA, FIBROTIC CYST WALL WITH
CALCIFICATIONS of the Left breast, 8:00 o'clock 4 cm fn. This was
found to be concordant by Dr. Bakinskiy Kamiar. Pathology results were
discussed with the patient by telephone. The patient reported doing
well after the biopsy with tenderness at the site. Post biopsy
instructions and care were reviewed and questions were answered. The
patient was encouraged to call [REDACTED] for any additional concerns. The patient was instructed to
return for annual screening mammography and informed a reminder
notice would be sent regarding this appointment. The patient has
requested future mammography be performed at The [REDACTED].

Pathology results reported by Akhila Adonis, RN on 05/06/2017.
CLINICAL DATA: 46-year-old female for ultrasound-guided biopsy of
an indeterminate left breast mass at 8 o'clock
EXAM:
ULTRASOUND GUIDED LEFT BREAST CORE NEEDLE BIOPSY

[Series 1: us breast bx w loc dev 1st lesion img bx spec us g · 0.06mm/px · 11 of 11 slices shown]
[im 1/11]
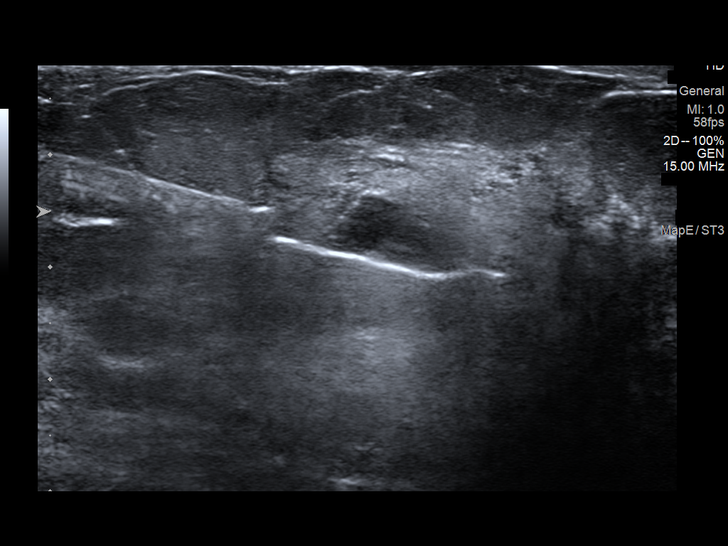
[im 2/11]
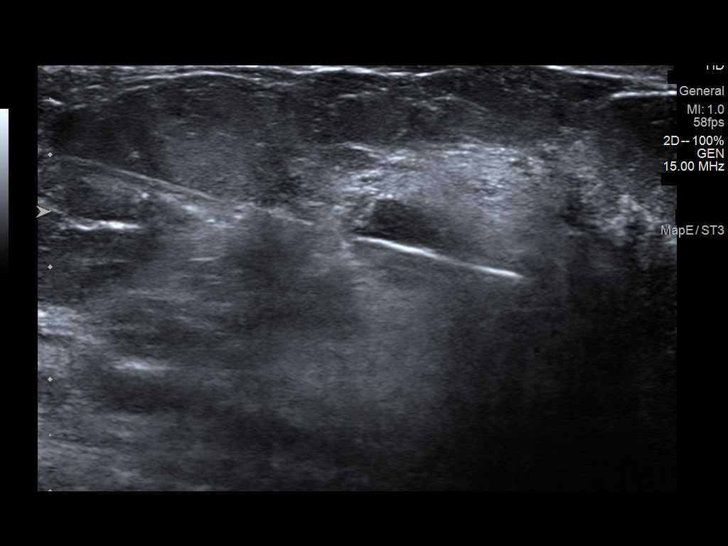
[im 3/11]
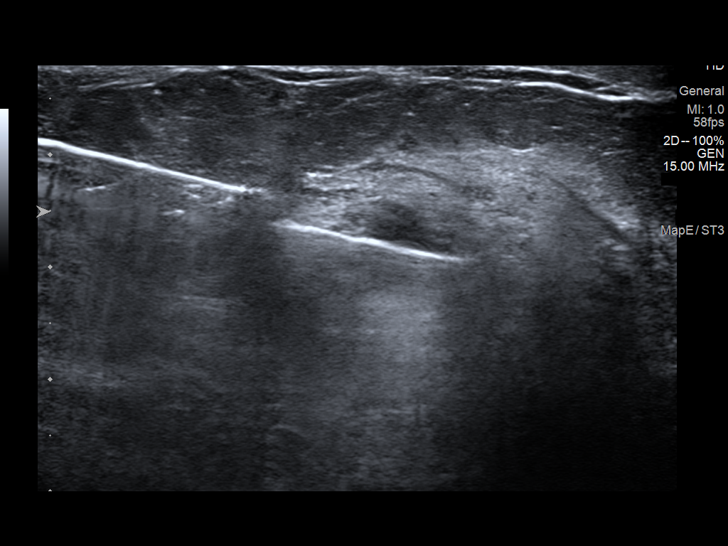
[im 4/11]
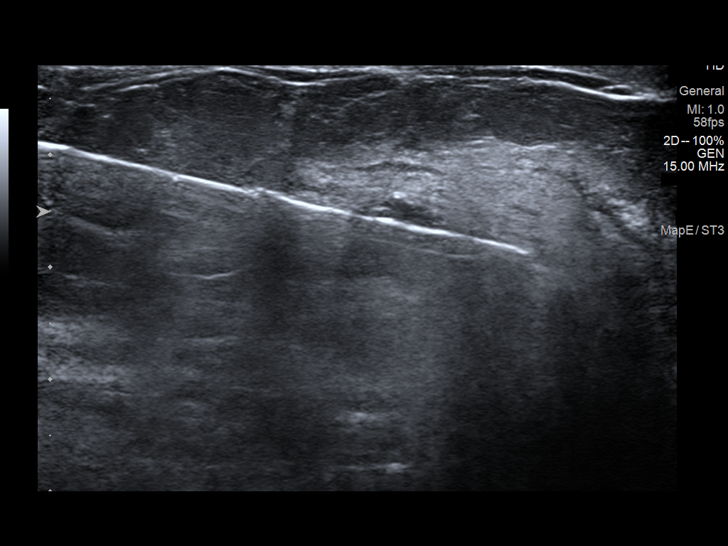
[im 5/11]
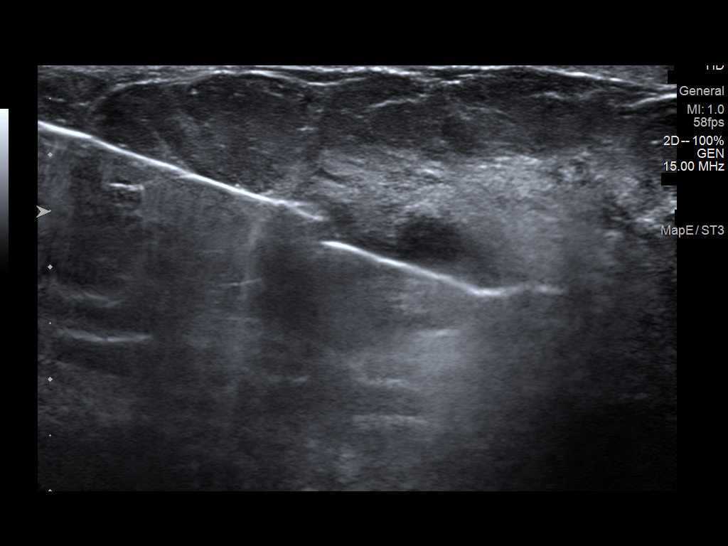
[im 6/11]
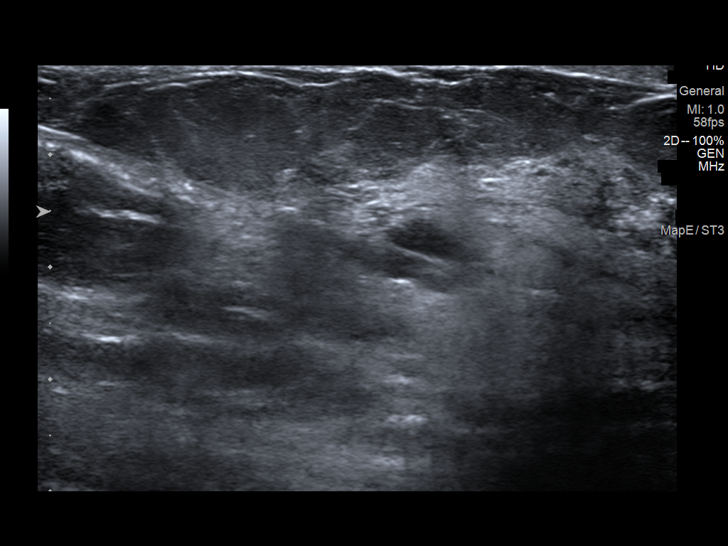
[im 7/11]
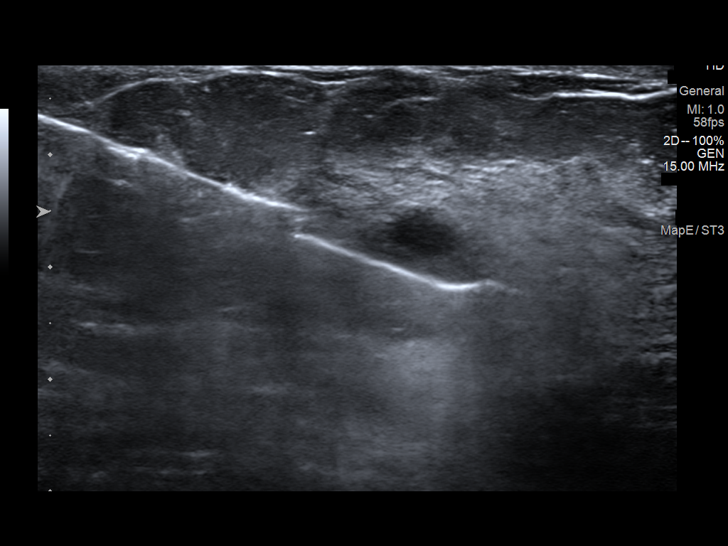
[im 8/11]
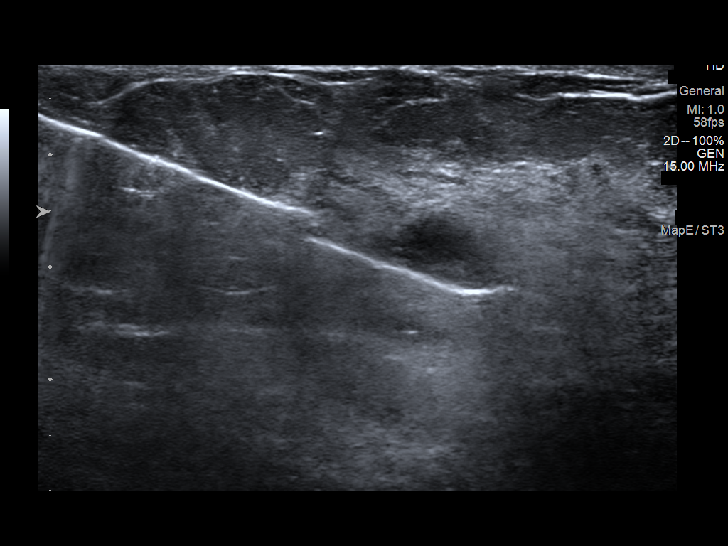
[im 9/11]
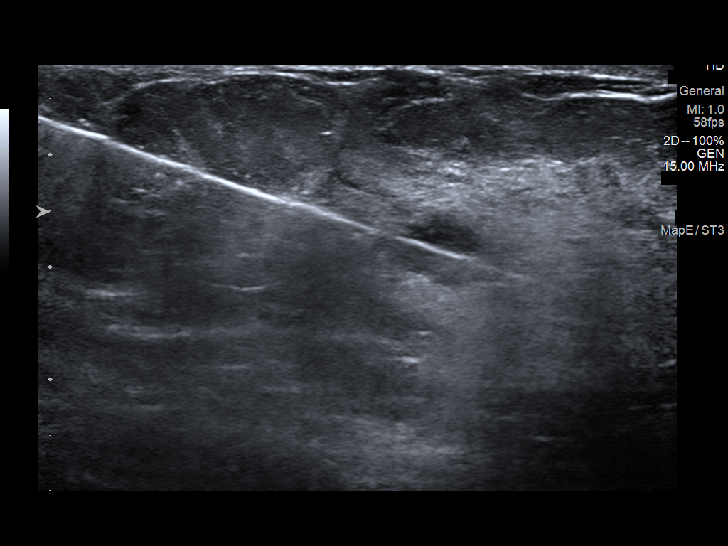
[im 10/11]
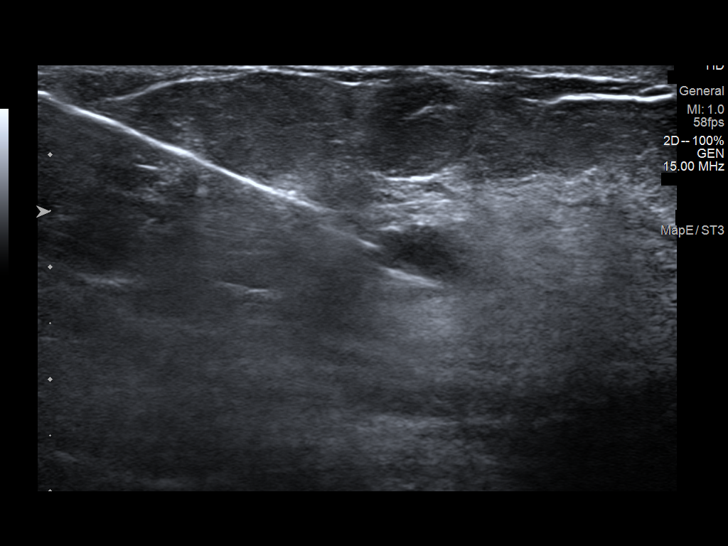
[im 11/11]
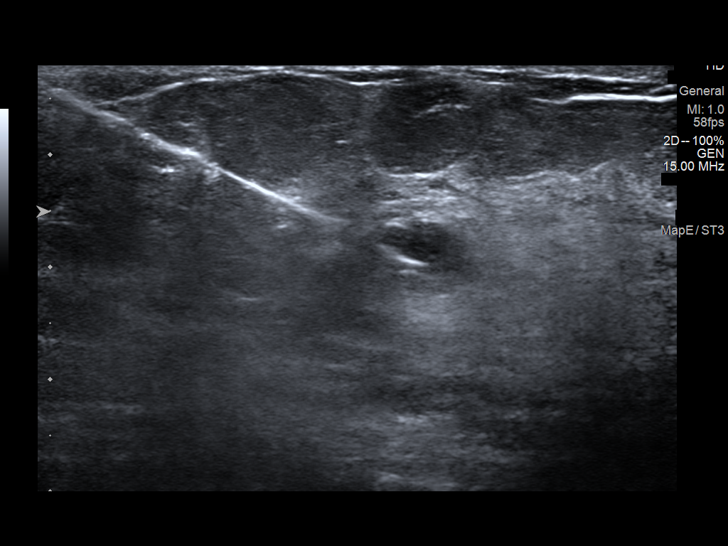

[11 of 11 positions shown; findings below may reference images not displayed]



Lesion quadrant: Lower inner quadrant

Using sterile technique and 1% Lidocaine as local anesthetic, under
direct ultrasound visualization, a 12 gauge Gebert device was
used to perform biopsy of an indeterminate left breast mass at 8
o'clock, 4 cm from the nipple using a medial to lateral approach. At
the conclusion of the procedure a ribbon shaped tissue marker clip
was deployed into the biopsy cavity. Follow up 2 view mammogram was
performed and dictated separately.
IMPRESSION: Ultrasound guided biopsy of an indeterminate left breast mass at 8
o'clock, 4 cm from the nipple. No apparent complications.

## 2018-08-27 DIAGNOSIS — Z20818 Contact with and (suspected) exposure to other bacterial communicable diseases: Secondary | ICD-10-CM | POA: Insufficient documentation

## 2019-01-29 ENCOUNTER — Other Ambulatory Visit: Payer: Self-pay | Admitting: Obstetrics and Gynecology

## 2019-01-29 DIAGNOSIS — Z1231 Encounter for screening mammogram for malignant neoplasm of breast: Secondary | ICD-10-CM

## 2019-05-06 ENCOUNTER — Ambulatory Visit: Payer: Managed Care, Other (non HMO)

## 2019-06-08 ENCOUNTER — Ambulatory Visit
Admission: RE | Admit: 2019-06-08 | Discharge: 2019-06-08 | Disposition: A | Discharge status: Home / Self Care | Payer: Managed Care, Other (non HMO) | Source: Ambulatory Visit | Attending: Obstetrics and Gynecology | Admitting: Obstetrics and Gynecology

## 2019-06-08 ENCOUNTER — Other Ambulatory Visit: Payer: Self-pay

## 2019-06-08 DIAGNOSIS — Z1231 Encounter for screening mammogram for malignant neoplasm of breast: Secondary | ICD-10-CM

## 2019-06-21 DIAGNOSIS — J3089 Other allergic rhinitis: Secondary | ICD-10-CM | POA: Insufficient documentation

## 2019-06-21 DIAGNOSIS — Z0000 Encounter for general adult medical examination without abnormal findings: Secondary | ICD-10-CM | POA: Insufficient documentation

## 2019-06-21 DIAGNOSIS — M722 Plantar fascial fibromatosis: Secondary | ICD-10-CM | POA: Insufficient documentation

## 2019-08-14 IMAGING — MG DIGITAL SCREENING BILATERAL MAMMOGRAM WITH TOMO AND CAD
8 series · 9 of 24 positions shown · non-contrast
Comparison: Previous exam(s).

CLINICAL DATA: Screening.

EXAM:
DIGITAL SCREENING BILATERAL MAMMOGRAM WITH TOMO AND CAD

[R CC synth-2D]
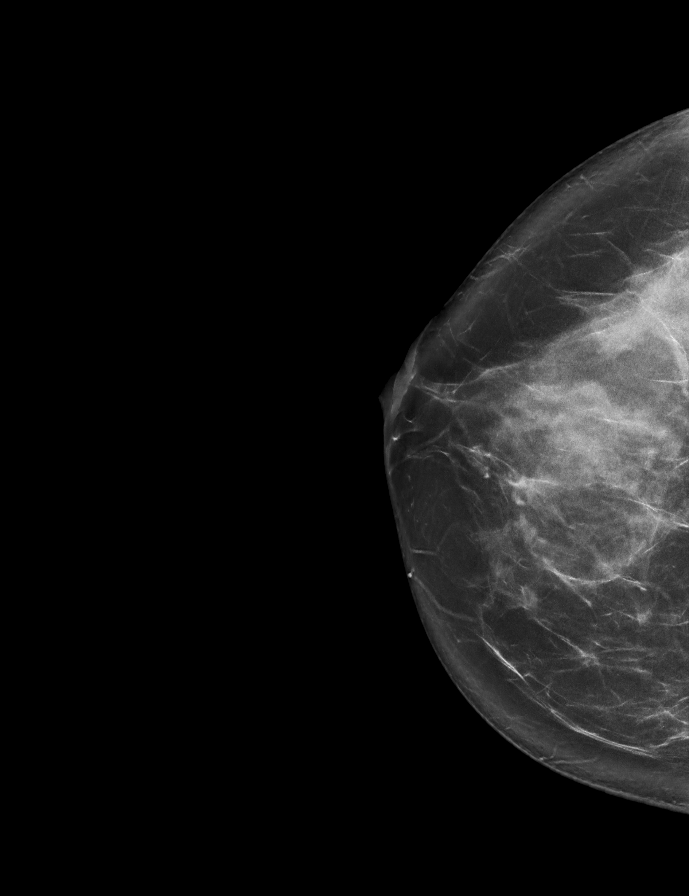

[R MLO synth-2D]
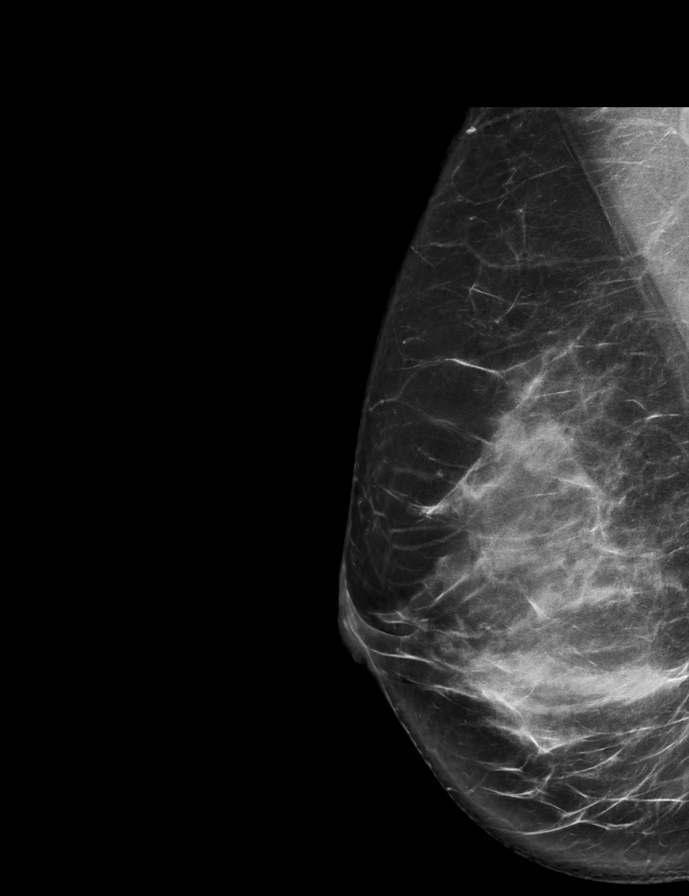

[L MLO synth-2D]
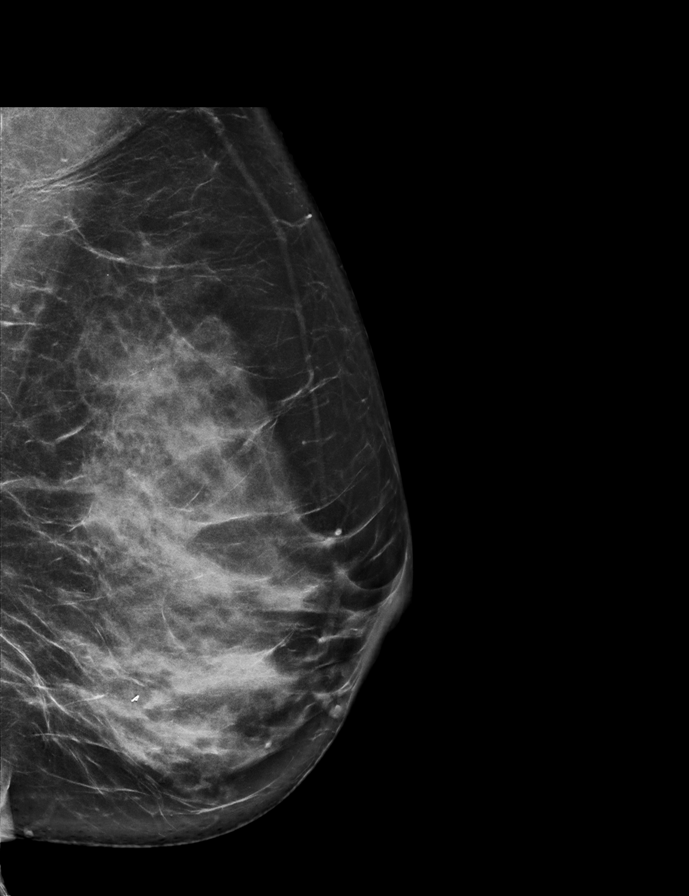

[L CC synth-2D]
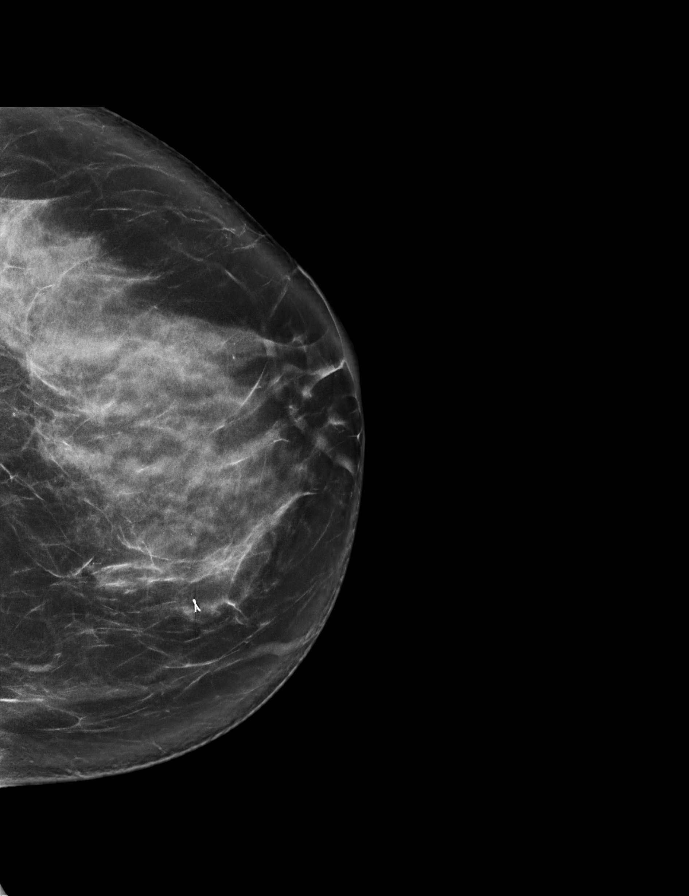

[R CC tomo · 2 of 83 frames shown]
[frame 27/83]
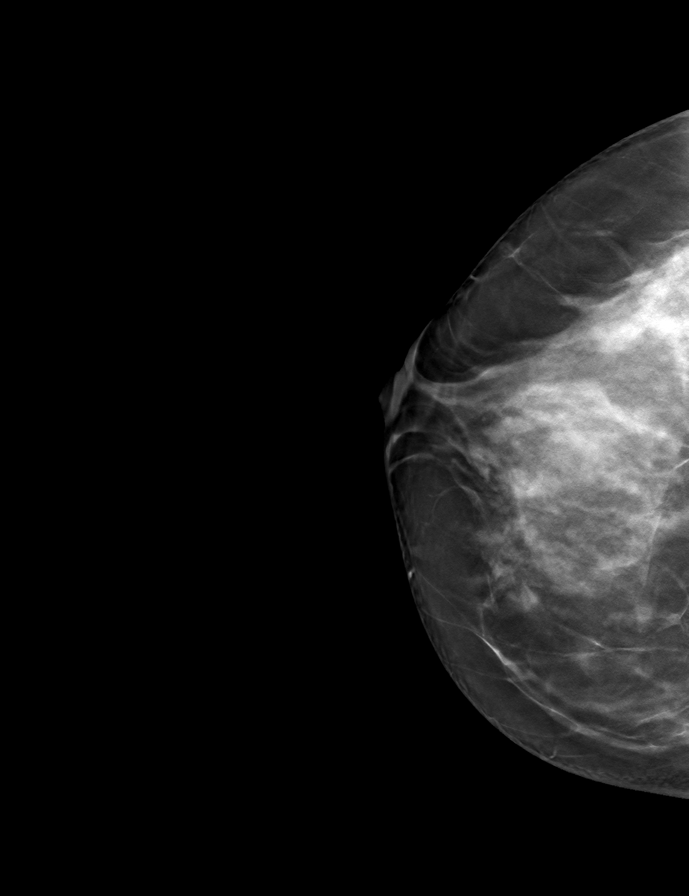
[frame 42/83]
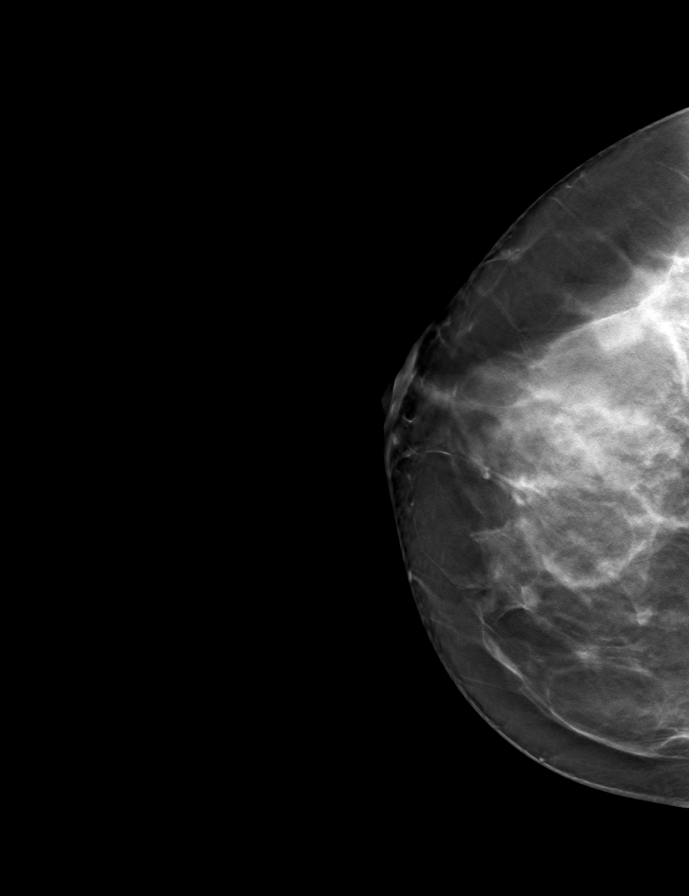

[R MLO tomo · tomo slice 41/81.0]
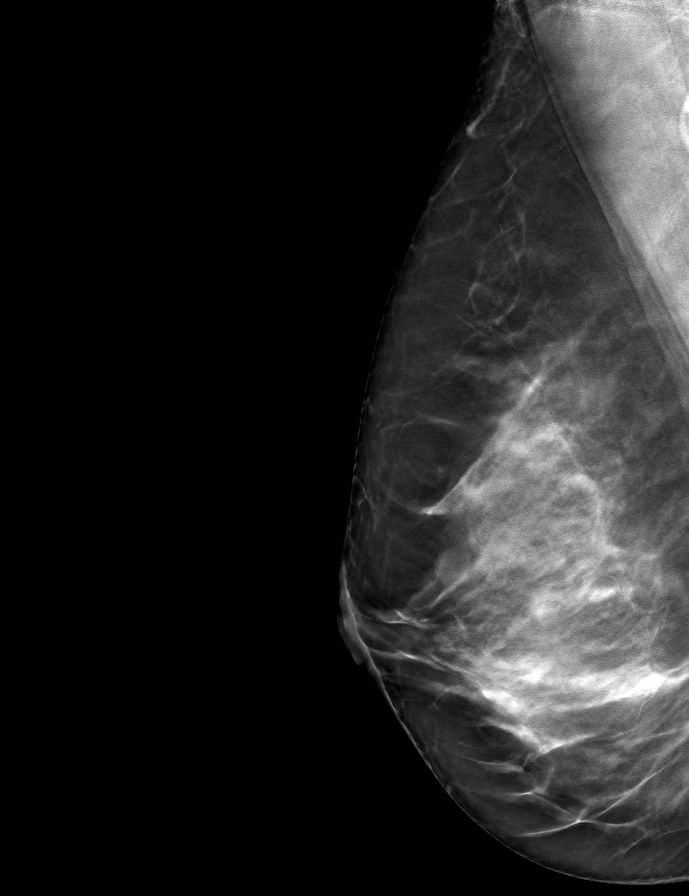

[L MLO tomo · tomo slice 41/82.0]
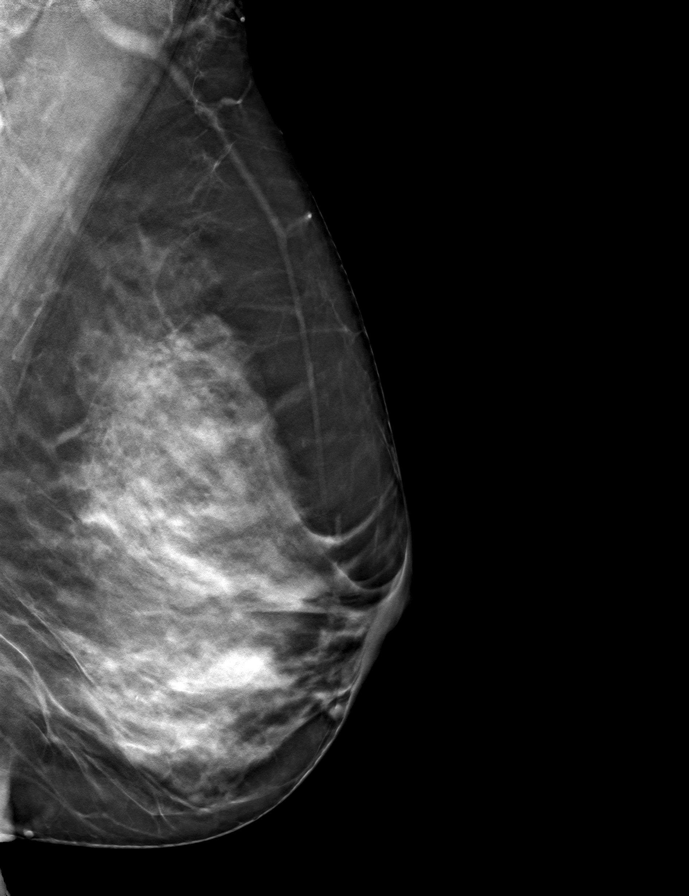

[L CC tomo · tomo slice 42/83.0]
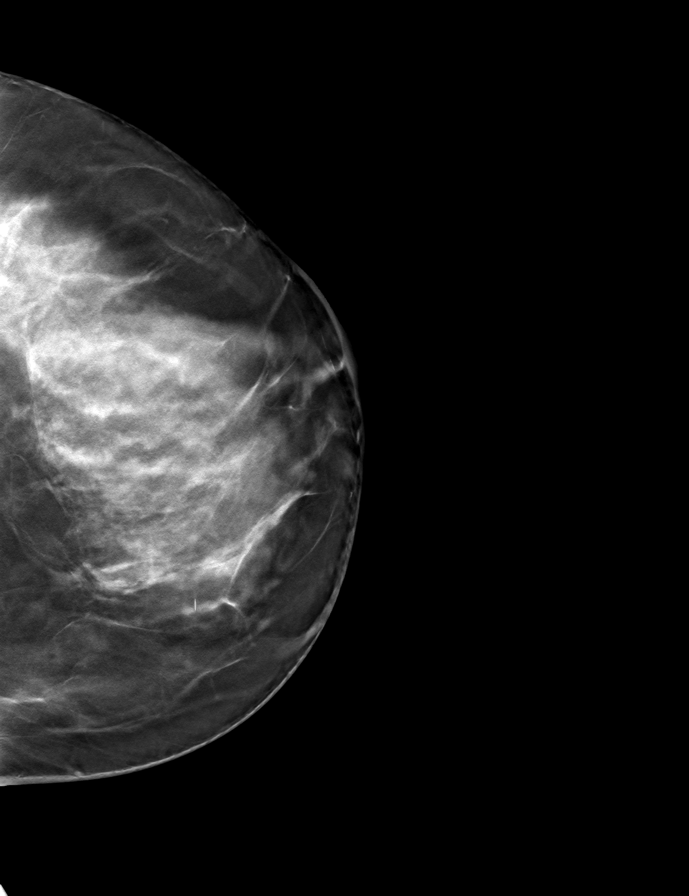

[9 of 24 positions shown; findings below may reference images not displayed]

ACR Breast Density Category c: The breast tissue is heterogeneously
dense, which may obscure small masses.
FINDINGS: There are no findings suspicious for malignancy. Images were
processed with CAD.
IMPRESSION: No mammographic evidence of malignancy. A result letter of this
screening mammogram will be mailed directly to the patient.

RECOMMENDATION:
Screening mammogram in one year. (Code:FT-U-LHB)

BI-RADS CATEGORY  1: Negative.

## 2020-04-04 ENCOUNTER — Other Ambulatory Visit: Payer: Self-pay | Admitting: Obstetrics and Gynecology

## 2020-04-04 DIAGNOSIS — Z1231 Encounter for screening mammogram for malignant neoplasm of breast: Secondary | ICD-10-CM

## 2020-05-26 ENCOUNTER — Other Ambulatory Visit: Payer: Self-pay

## 2020-05-26 ENCOUNTER — Other Ambulatory Visit: Payer: Self-pay | Admitting: Sports Medicine

## 2020-05-26 ENCOUNTER — Ambulatory Visit: Payer: Managed Care, Other (non HMO) | Admitting: Sports Medicine

## 2020-05-26 ENCOUNTER — Encounter: Payer: Self-pay | Admitting: Sports Medicine

## 2020-05-26 ENCOUNTER — Ambulatory Visit (INDEPENDENT_AMBULATORY_CARE_PROVIDER_SITE_OTHER): Payer: Managed Care, Other (non HMO)

## 2020-05-26 DIAGNOSIS — M7662 Achilles tendinitis, left leg: Secondary | ICD-10-CM | POA: Diagnosis not present

## 2020-05-26 DIAGNOSIS — M216X1 Other acquired deformities of right foot: Secondary | ICD-10-CM | POA: Diagnosis not present

## 2020-05-26 DIAGNOSIS — M216X2 Other acquired deformities of left foot: Secondary | ICD-10-CM

## 2020-05-26 DIAGNOSIS — M722 Plantar fascial fibromatosis: Secondary | ICD-10-CM

## 2020-05-26 DIAGNOSIS — M79672 Pain in left foot: Secondary | ICD-10-CM

## 2020-05-26 DIAGNOSIS — M79671 Pain in right foot: Secondary | ICD-10-CM

## 2020-05-26 NOTE — Patient Instructions (Signed)
Plantar Fasciitis (Heel Spur Syndrome) with Rehab The plantar fascia is a fibrous, ligament-like, soft-tissue structure that spans the bottom of the foot. Plantar fasciitis is a condition that causes pain in the foot due to inflammation of the tissue. SYMPTOMS   Pain and tenderness on the underneath side of the foot.  Pain that worsens with standing or walking. CAUSES  Plantar fasciitis is caused by irritation and injury to the plantar fascia on the underneath side of the foot. Common mechanisms of injury include:  Direct trauma to bottom of the foot.  Damage to a small nerve that runs under the foot where the main fascia attaches to the heel bone.  Stress placed on the plantar fascia due to bone spurs. RISK INCREASES WITH:   Activities that place stress on the plantar fascia (running, jumping, pivoting, or cutting).  Poor strength and flexibility.  Improperly fitted shoes.  Tight calf muscles.  Flat feet.  Failure to warm-up properly before activity.  Obesity. PREVENTION  Warm up and stretch properly before activity.  Allow for adequate recovery between workouts.  Maintain physical fitness:  Strength, flexibility, and endurance.  Cardiovascular fitness.  Maintain a health body weight.  Avoid stress on the plantar fascia.  Wear properly fitted shoes, including arch supports for individuals who have flat feet.  PROGNOSIS  If treated properly, then the symptoms of plantar fasciitis usually resolve without surgery. However, occasionally surgery is necessary.  RELATED COMPLICATIONS   Recurrent symptoms that may result in a chronic condition.  Problems of the lower back that are caused by compensating for the injury, such as limping.  Pain or weakness of the foot during push-off following surgery.  Chronic inflammation, scarring, and partial or complete fascia tear, occurring more often from repeated injections.  TREATMENT  Treatment initially involves the  use of ice and medication to help reduce pain and inflammation. The use of strengthening and stretching exercises may help reduce pain with activity, especially stretches of the Achilles tendon. These exercises may be performed at home or with a therapist. Your caregiver may recommend that you use heel cups of arch supports to help reduce stress on the plantar fascia. Occasionally, corticosteroid injections are given to reduce inflammation. If symptoms persist for greater than 6 months despite non-surgical (conservative), then surgery may be recommended.   MEDICATION   If pain medication is necessary, then nonsteroidal anti-inflammatory medications, such as aspirin and ibuprofen, or other minor pain relievers, such as acetaminophen, are often recommended.  Do not take pain medication within 7 days before surgery.  Prescription pain relievers may be given if deemed necessary by your caregiver. Use only as directed and only as much as you need.  Corticosteroid injections may be given by your caregiver. These injections should be reserved for the most serious cases, because they may only be given a certain number of times.  HEAT AND COLD  Cold treatment (icing) relieves pain and reduces inflammation. Cold treatment should be applied for 10 to 15 minutes every 2 to 3 hours for inflammation and pain and immediately after any activity that aggravates your symptoms. Use ice packs or massage the area with a piece of ice (ice massage).  Heat treatment may be used prior to performing the stretching and strengthening activities prescribed by your caregiver, physical therapist, or athletic trainer. Use a heat pack or soak the injury in warm water.  SEEK IMMEDIATE MEDICAL CARE IF:  Treatment seems to offer no benefit, or the condition worsens.  Any medications   produce adverse side effects.  EXERCISES- RANGE OF MOTION (ROM) AND STRETCHING EXERCISES - Plantar Fasciitis (Heel Spur Syndrome) These exercises  may help you when beginning to rehabilitate your injury. Your symptoms may resolve with or without further involvement from your physician, physical therapist or athletic trainer. While completing these exercises, remember:   Restoring tissue flexibility helps normal motion to return to the joints. This allows healthier, less painful movement and activity.  An effective stretch should be held for at least 30 seconds.  A stretch should never be painful. You should only feel a gentle lengthening or release in the stretched tissue.  RANGE OF MOTION - Toe Extension, Flexion  Sit with your right / left leg crossed over your opposite knee.  Grasp your toes and gently pull them back toward the top of your foot. You should feel a stretch on the bottom of your toes and/or foot.  Hold this stretch for 10 seconds.  Now, gently pull your toes toward the bottom of your foot. You should feel a stretch on the top of your toes and or foot.  Hold this stretch for 10 seconds. Repeat  times. Complete this stretch 3 times per day.   RANGE OF MOTION - Ankle Dorsiflexion, Active Assisted  Remove shoes and sit on a chair that is preferably not on a carpeted surface.  Place right / left foot under knee. Extend your opposite leg for support.  Keeping your heel down, slide your right / left foot back toward the chair until you feel a stretch at your ankle or calf. If you do not feel a stretch, slide your bottom forward to the edge of the chair, while still keeping your heel down.  Hold this stretch for 10 seconds. Repeat 3 times. Complete this stretch 2 times per day.   STRETCH  Gastroc, Standing  Place hands on wall.  Extend right / left leg, keeping the front knee somewhat bent.  Slightly point your toes inward on your back foot.  Keeping your right / left heel on the floor and your knee straight, shift your weight toward the wall, not allowing your back to arch.  You should feel a gentle stretch  in the right / left calf. Hold this position for 10 seconds. Repeat 3 times. Complete this stretch 2 times per day.  STRETCH  Soleus, Standing  Place hands on wall.  Extend right / left leg, keeping the other knee somewhat bent.  Slightly point your toes inward on your back foot.  Keep your right / left heel on the floor, bend your back knee, and slightly shift your weight over the back leg so that you feel a gentle stretch deep in your back calf.  Hold this position for 10 seconds. Repeat 3 times. Complete this stretch 2 times per day.  STRETCH  Gastrocsoleus, Standing  Note: This exercise can place a lot of stress on your foot and ankle. Please complete this exercise only if specifically instructed by your caregiver.   Place the ball of your right / left foot on a step, keeping your other foot firmly on the same step.  Hold on to the wall or a rail for balance.  Slowly lift your other foot, allowing your body weight to press your heel down over the edge of the step.  You should feel a stretch in your right / left calf.  Hold this position for 10 seconds.  Repeat this exercise with a slight bend in your right /   left knee. Repeat 3 times. Complete this stretch 2 times per day.   STRENGTHENING EXERCISES - Plantar Fasciitis (Heel Spur Syndrome)  These exercises may help you when beginning to rehabilitate your injury. They may resolve your symptoms with or without further involvement from your physician, physical therapist or athletic trainer. While completing these exercises, remember:   Muscles can gain both the endurance and the strength needed for everyday activities through controlled exercises.  Complete these exercises as instructed by your physician, physical therapist or athletic trainer. Progress the resistance and repetitions only as guided.  STRENGTH - Towel Curls  Sit in a chair positioned on a non-carpeted surface.  Place your foot on a towel, keeping your heel  on the floor.  Pull the towel toward your heel by only curling your toes. Keep your heel on the floor. Repeat 3 times. Complete this exercise 2 times per day.  STRENGTH - Ankle Inversion  Secure one end of a rubber exercise band/tubing to a fixed object (table, pole). Loop the other end around your foot just before your toes.  Place your fists between your knees. This will focus your strengthening at your ankle.  Slowly, pull your big toe up and in, making sure the band/tubing is positioned to resist the entire motion.  Hold this position for 10 seconds.  Have your muscles resist the band/tubing as it slowly pulls your foot back to the starting position. Repeat 3 times. Complete this exercises 2 times per day.  Document Released: 08/26/2005 Document Revised: 11/18/2011 Document Reviewed: 12/08/2008 [employer] Patient Information 2014 [employer]. Achilles Tendinitis  with Rehab Achilles tendinitis is a disorder of the Achilles tendon. The Achilles tendon connects the large calf muscles (Gastrocnemius and Soleus) to the heel bone (calcaneus). This tendon is sometimes called the heel cord. It is important for pushing-off and standing on your toes and is important for walking, running, or jumping. Tendinitis is often caused by overuse and repetitive microtrauma. SYMPTOMS  Pain, tenderness, swelling, warmth, and redness may occur over the Achilles tendon even at rest.  Pain with pushing off, or flexing or extending the ankle.  Pain that is worsened after or during activity. CAUSES   Overuse sometimes seen with rapid increase in exercise programs or in sports requiring running and jumping.  Poor physical conditioning (strength and flexibility or endurance).  Running sports, especially training running down hills.  Inadequate warm-up before practice or play or failure to stretch before participation.  Injury to the tendon. PREVENTION   Warm up and stretch before practice or  competition.  Allow time for adequate rest and recovery between practices and competition.  Keep up conditioning.  Keep up ankle and leg flexibility.  Improve or keep muscle strength and endurance.  Improve cardiovascular fitness.  Use proper technique.  Use proper equipment (shoes, skates).  To help prevent recurrence, taping, protective strapping, or an adhesive bandage may be recommended for several weeks after healing is complete. PROGNOSIS   Recovery may take weeks to several months to heal.  Longer recovery is expected if symptoms have been prolonged.  Recovery is usually quicker if the inflammation is due to a direct blow as compared with overuse or sudden strain. RELATED COMPLICATIONS   Healing time will be prolonged if the condition is not correctly treated. The injury must be given plenty of time to heal.  Symptoms can reoccur if activity is resumed too soon.  Untreated, tendinitis may increase the risk of tendon rupture requiring additional time for recovery   and possibly surgery. TREATMENT   The first treatment consists of rest anti-inflammatory medication, and ice to relieve the pain.  Stretching and strengthening exercises after resolution of pain will likely help reduce the risk of recurrence. Referral to a physical therapist or athletic trainer for further evaluation and treatment may be helpful.  A walking boot or cast may be recommended to rest the Achilles tendon. This can help break the cycle of inflammation and microtrauma.  Arch supports (orthotics) may be prescribed or recommended by your caregiver as an adjunct to therapy and rest.  Surgery to remove the inflamed tendon lining or degenerated tendon tissue is rarely necessary and has shown less than predictable results. MEDICATION   Nonsteroidal anti-inflammatory medications, such as aspirin and ibuprofen, may be used for pain and inflammation relief. Do not take within 7 days before surgery. Take  these as directed by your caregiver. Contact your caregiver immediately if any bleeding, stomach upset, or signs of allergic reaction occur. Other minor pain relievers, such as acetaminophen, may also be used.  Pain relievers may be prescribed as necessary by your caregiver. Do not take prescription pain medication for longer than 4 to 7 days. Use only as directed and only as much as you need.  Cortisone injections are rarely indicated. Cortisone injections may weaken tendons and predispose to rupture. It is better to give the condition more time to heal than to use them. HEAT AND COLD  Cold is used to relieve pain and reduce inflammation for acute and chronic Achilles tendinitis. Cold should be applied for 10 to 15 minutes every 2 to 3 hours for inflammation and pain and immediately after any activity that aggravates your symptoms. Use ice packs or an ice massage.  Heat may be used before performing stretching and strengthening activities prescribed by your caregiver. Use a heat pack or a warm soak. SEEK MEDICAL CARE IF:  Symptoms get worse or do not improve in 2 weeks despite treatment.  New, unexplained symptoms develop. Drugs used in treatment may produce side effects.  EXERCISES:  RANGE OF MOTION (ROM) AND STRETCHING EXERCISES - Achilles Tendinitis  These exercises may help you when beginning to rehabilitate your injury. Your symptoms may resolve with or without further involvement from your physician, physical therapist or athletic trainer. While completing these exercises, remember:   Restoring tissue flexibility helps normal motion to return to the joints. This allows healthier, less painful movement and activity.  An effective stretch should be held for at least 30 seconds.  A stretch should never be painful. You should only feel a gentle lengthening or release in the stretched tissue.  STRETCH  Gastroc, Standing   Place hands on wall.  Extend right / left leg, keeping the  front knee somewhat bent.  Slightly point your toes inward on your back foot.  Keeping your right / left heel on the floor and your knee straight, shift your weight toward the wall, not allowing your back to arch.  You should feel a gentle stretch in the right / left calf. Hold this position for 10 seconds. Repeat 3 times. Complete this stretch 2 times per day.  STRETCH  Soleus, Standing   Place hands on wall.  Extend right / left leg, keeping the other knee somewhat bent.  Slightly point your toes inward on your back foot.  Keep your right / left heel on the floor, bend your back knee, and slightly shift your weight over the back leg so that you feel a   gentle stretch deep in your back calf.  Hold this position for 10 seconds. Repeat 3 times. Complete this stretch 2 times per day.  STRETCH  Gastrocsoleus, Standing  Note: This exercise can place a lot of stress on your foot and ankle. Please complete this exercise only if specifically instructed by your caregiver.   Place the ball of your right / left foot on a step, keeping your other foot firmly on the same step.  Hold on to the wall or a rail for balance.  Slowly lift your other foot, allowing your body weight to press your heel down over the edge of the step.  You should feel a stretch in your right / left calf.  Hold this position for 10 seconds.  Repeat this exercise with a slight bend in your knee. Repeat 3 times. Complete this stretch 2 times per day.   STRENGTHENING EXERCISES - Achilles Tendinitis These exercises may help you when beginning to rehabilitate your injury. They may resolve your symptoms with or without further involvement from your physician, physical therapist or athletic trainer. While completing these exercises, remember:   Muscles can gain both the endurance and the strength needed for everyday activities through controlled exercises.  Complete these exercises as instructed by your physician,  physical therapist or athletic trainer. Progress the resistance and repetitions only as guided.  You may experience muscle soreness or fatigue, but the pain or discomfort you are trying to eliminate should never worsen during these exercises. If this pain does worsen, stop and make certain you are following the directions exactly. If the pain is still present after adjustments, discontinue the exercise until you can discuss the trouble with your clinician.  STRENGTH - Plantar-flexors   Sit with your right / left leg extended. Holding onto both ends of a rubber exercise band/tubing, loop it around the ball of your foot. Keep a slight tension in the band.  Slowly push your toes away from you, pointing them downward.  Hold this position for 10 seconds. Return slowly, controlling the tension in the band/tubing. Repeat 3 times. Complete this exercise 2 times per day.   STRENGTH - Plantar-flexors   Stand with your feet shoulder width apart. Steady yourself with a wall or table using as little support as needed.  Keeping your weight evenly spread over the width of your feet, rise up on your toes.*  Hold this position for 10 seconds. Repeat 3 times. Complete this exercise 2 times per day.  *If this is too easy, shift your weight toward your right / left leg until you feel challenged. Ultimately, you may be asked to do this exercise with your right / left foot only.  STRENGTH  Plantar-flexors, Eccentric  Note: This exercise can place a lot of stress on your foot and ankle. Please complete this exercise only if specifically instructed by your caregiver.   Place the balls of your feet on a step. With your hands, use only enough support from a wall or rail to keep your balance.  Keep your knees straight and rise up on your toes.  Slowly shift your weight entirely to your right / left toes and pick up your opposite foot. Gently and with controlled movement, lower your weight through your right /  left foot so that your heel drops below the level of the step. You will feel a slight stretch in the back of your calf at the end position.  Use the healthy leg to help rise up onto   the balls of both feet, then lower weight only on the right / left leg again. Build up to 15 repetitions. Then progress to 3 consecutive sets of 15 repetitions.*  After completing the above exercise, complete the same exercise with a slight knee bend (about 30 degrees). Again, build up to 15 repetitions. Then progress to 3 consecutive sets of 15 repetitions.* Perform this exercise 2 times per day.  *When you easily complete 3 sets of 15, your physician, physical therapist or athletic trainer may advise you to add resistance by wearing a backpack filled with additional weight.  STRENGTH - Plantar Flexors, Seated   Sit on a chair that allows your feet to rest flat on the ground. If necessary, sit at the edge of the chair.  Keeping your toes firmly on the ground, lift your right / left heel as far as you can without increasing any discomfort in your ankle. Repeat 3 times. Complete this exercise 2 times a day.  

## 2020-05-26 NOTE — Progress Notes (Signed)
Subjective: Amanda Bush is a 49 y.o. female patient presents to office with complaint of pain to heels, on the right the pain has been going on 1 year at the bottom and on the left heel at the back for the last month with a knot with pain worse on the right in the AM with 1st few steps and pain in the PM of burning to back of the left heel. Has tried icing and elevating with no relief. Denies any other pedal complaints.   Review of Systems  All other systems reviewed and are negative.    Patient Active Problem List   Diagnosis Date Noted   Annual physical exam 06/21/2019   Environmental and seasonal allergies 06/21/2019   Plantar fasciitis 06/21/2019   Streptococcus exposure 08/27/2018   Dysuria 07/16/2018   Urticaria 07/30/2017    Current Outpatient Medications on File Prior to Visit  Medication Sig Dispense Refill   fluticasone (FLONASE) 50 MCG/ACT nasal spray Place into the nose.     levocetirizine (XYZAL) 5 MG tablet Take by mouth.     rosuvastatin (CRESTOR) 5 MG tablet Take by mouth.     hydrocortisone (ANUSOL-HC) 2.5 % rectal cream Apply topically as directed.     ibuprofen (ADVIL,MOTRIN) 600 MG tablet Take 1 tablet (600 mg total) by mouth every 6 (six) hours. 30 tablet 0   influenza vac split quadrivalent PF (FLULAVAL QUADRIVALENT) 0.5 ML injection Flulaval Quad 2020-2021 (PF) 60 mcg (15 mcg x 4)/0.5 mL IM syringe  INJECT 0.5 ML INTO THE MUSCLE ONCE     No current facility-administered medications on file prior to visit.    Allergies  Allergen Reactions   Cefdinir Diarrhea    Objective: Physical Exam General: The patient is alert and oriented x3 in no acute distress.  Dermatology: Skin is warm, dry and supple bilateral lower extremities. Nails 1-10 are normal. There is no erythema, edema, no eccymosis, no open lesions present. Integument is otherwise unremarkable.  Vascular: Dorsalis Pedis pulse and Posterior Tibial pulse are 2/4 bilateral. Capillary  fill time is immediate to all digits.  Neurological: Grossly intact to light touch bilateral.  Musculoskeletal: Tenderness to palpation at the medial calcaneal tubercale and through the insertion of the plantar fascia on the right foot. There is tenderness to palpation at achilles insertion on the left with hypertrophic achilles on left, No pain with compression of calcaneus bilateral. No pain with calf compression bilateral. There is decreased Ankle joint range of motion bilateral. All other joints range of motion within normal limits bilateral. Strength 5/5 in all groups bilateral.    Xray, Right/Left foot:  Normal osseous mineralization. Joint spaces preserved. No fracture/dislocation/boney destruction. Minimal Calcaneal spur present with mild thickening of plantar fascia. No other soft tissue abnormalities or radiopaque foreign bodies.   Assessment and Plan: Problem List Items Addressed This Visit    None    Visit Diagnoses    Plantar fasciitis, right    -  Primary   Tendonitis, Achilles, left       Heel pain, bilateral       Acquired equinus deformity of both feet         -Complete examination performed.  -Xrays reviewed -Discussed with patient in detail the condition of plantar fasciitis and achilles tendonitis, how this occurs and general treatment options. Explained both conservative and surgical treatments.  -No injection or meds given due to minimal pain 2-3/10 -Recommended good supportive shoes and advised use of heel lifts as dispensed today's  visit - Explained in detail the use of the night splint which was dispensed at today's visit. -Explained and dispensed to patient daily stretching exercises. -Recommend patient to ice affected area 1-2x daily. -Patient to return to office in 3-4 weeks for follow up or sooner if problems or questions arise.  Asencion Islam, DPM

## 2020-06-12 ENCOUNTER — Ambulatory Visit
Admission: RE | Admit: 2020-06-12 | Discharge: 2020-06-12 | Disposition: A | Discharge status: Home / Self Care | Payer: Managed Care, Other (non HMO) | Source: Ambulatory Visit | Attending: Obstetrics and Gynecology | Admitting: Obstetrics and Gynecology

## 2020-06-12 ENCOUNTER — Other Ambulatory Visit: Payer: Self-pay

## 2020-06-12 DIAGNOSIS — Z1231 Encounter for screening mammogram for malignant neoplasm of breast: Secondary | ICD-10-CM

## 2020-06-23 ENCOUNTER — Encounter: Payer: Self-pay | Admitting: Sports Medicine

## 2020-06-23 ENCOUNTER — Ambulatory Visit: Payer: Managed Care, Other (non HMO) | Admitting: Sports Medicine

## 2020-06-23 ENCOUNTER — Other Ambulatory Visit: Payer: Self-pay

## 2020-06-23 DIAGNOSIS — M79671 Pain in right foot: Secondary | ICD-10-CM | POA: Diagnosis not present

## 2020-06-23 DIAGNOSIS — M722 Plantar fascial fibromatosis: Secondary | ICD-10-CM | POA: Diagnosis not present

## 2020-06-23 DIAGNOSIS — M7662 Achilles tendinitis, left leg: Secondary | ICD-10-CM | POA: Diagnosis not present

## 2020-06-23 DIAGNOSIS — M216X1 Other acquired deformities of right foot: Secondary | ICD-10-CM | POA: Diagnosis not present

## 2020-06-23 DIAGNOSIS — M79672 Pain in left foot: Secondary | ICD-10-CM

## 2020-06-23 DIAGNOSIS — M216X2 Other acquired deformities of left foot: Secondary | ICD-10-CM

## 2020-06-23 MED ORDER — MELOXICAM 15 MG PO TABS: 15.0000 mg | ORAL_TABLET | Freq: Every day | ORAL | 0 refills | Status: AC

## 2020-06-23 MED ORDER — PREDNISONE 10 MG (21) PO TBPK: ORAL_TABLET | ORAL | 0 refills | Status: AC

## 2020-06-23 NOTE — Progress Notes (Signed)
Subjective: Amanda Bush is a 49 y.o. female patient returns to office with complaint of pain to heels, worse on left than right. Inserts have helped but went to the beach and did some walking on the sand that made the left flare up more, pain 3-4/10. Reports that she has been wearing night splint but is unsure if it stays on because sometimes it is off and ice hurts. No other issues noted.    Patient Active Problem List   Diagnosis Date Noted  . Annual physical exam 06/21/2019  . Environmental and seasonal allergies 06/21/2019  . Plantar fasciitis 06/21/2019  . Streptococcus exposure 08/27/2018  . Dysuria 07/16/2018  . Urticaria 07/30/2017    Current Outpatient Medications on File Prior to Visit  Medication Sig Dispense Refill  . fluticasone (FLONASE) 50 MCG/ACT nasal spray Place into the nose.    . hydrocortisone (ANUSOL-HC) 2.5 % rectal cream Apply topically as directed.    Marland Kitchen ibuprofen (ADVIL,MOTRIN) 600 MG tablet Take 1 tablet (600 mg total) by mouth every 6 (six) hours. 30 tablet 0  . influenza vac split quadrivalent PF (FLULAVAL QUADRIVALENT) 0.5 ML injection Flulaval Quad 2020-2021 (PF) 60 mcg (15 mcg x 4)/0.5 mL IM syringe  INJECT 0.5 ML INTO THE MUSCLE ONCE    . levocetirizine (XYZAL) 5 MG tablet Take by mouth.    . oxybutynin (DITROPAN-XL) 5 MG 24 hr tablet oxybutynin chloride ER 5 mg tablet,extended release 24 hr  Take 1 tablet every day by oral route.    . rosuvastatin (CRESTOR) 5 MG tablet Take by mouth.     No current facility-administered medications on file prior to visit.    Allergies  Allergen Reactions  . Cefdinir Diarrhea    Objective: Physical Exam General: The patient is alert and oriented x3 in no acute distress.  Dermatology: Skin is warm, dry and supple bilateral lower extremities. Nails 1-10 are normal. There is no erythema, edema, no eccymosis, no open lesions present. Integument is otherwise unremarkable.  Vascular: Dorsalis Pedis pulse and  Posterior Tibial pulse are 2/4 bilateral. Capillary fill time is immediate to all digits.  Neurological: Grossly intact to light touch bilateral.  Musculoskeletal:  Mild tenderness to palpation at the medial calcaneal tubercale and through the insertion of the plantar fascia on the right foot. There is more intense tenderness to palpation at achilles insertion on the left with hypertrophic achilles on left, No pain with compression of calcaneus bilateral. No pain with calf compression bilateral. There is decreased Ankle joint range of motion bilateral. All other joints range of motion within normal limits bilateral. Strength 5/5 in all groups bilateral.   Assessment and Plan: Problem List Items Addressed This Visit    None    Visit Diagnoses    Plantar fasciitis, right    -  Primary   Tendonitis, Achilles, left       Heel pain, bilateral       Acquired equinus deformity of both feet         -Complete examination performed -Re-Discussed with patient in detail the condition of plantar fasciitis and achilles tendonitis, how this occurs and general treatment options. Explained both conservative and surgical treatments.  -Rx Prednisone and Mobic to take as instead  -Recommended continue with good supportive shoes and orthotics/insoles -Advised patient to continue with night splint and gentle stretching as instructed advised patient to revisit using ice but make sure her ice pack isnt too cold -Recommend patient to ice affected area 1-2x daily. -Patient  to return to office in 4 weeks for follow up or sooner if problems or questions arise.  Advised patient if fails to continue to improve may consider injection versus physical therapy.  Asencion Islam, DPM

## 2020-07-14 ENCOUNTER — Ambulatory Visit: Payer: Managed Care, Other (non HMO) | Admitting: Sports Medicine

## 2020-09-11 ENCOUNTER — Other Ambulatory Visit: Payer: Self-pay | Admitting: Sports Medicine

## 2020-09-11 DIAGNOSIS — M7662 Achilles tendinitis, left leg: Secondary | ICD-10-CM

## 2020-09-11 DIAGNOSIS — M722 Plantar fascial fibromatosis: Secondary | ICD-10-CM

## 2021-03-29 ENCOUNTER — Other Ambulatory Visit: Payer: Self-pay | Admitting: Obstetrics and Gynecology

## 2021-03-29 DIAGNOSIS — Z1231 Encounter for screening mammogram for malignant neoplasm of breast: Secondary | ICD-10-CM

## 2021-06-18 ENCOUNTER — Ambulatory Visit: Payer: Managed Care, Other (non HMO)

## 2021-08-08 ENCOUNTER — Ambulatory Visit: Payer: Managed Care, Other (non HMO)

## 2021-08-09 ENCOUNTER — Ambulatory Visit
Admission: RE | Admit: 2021-08-09 | Discharge: 2021-08-09 | Disposition: A | Discharge status: Home / Self Care | Payer: Managed Care, Other (non HMO) | Source: Ambulatory Visit | Attending: Obstetrics and Gynecology | Admitting: Obstetrics and Gynecology

## 2021-08-09 ENCOUNTER — Other Ambulatory Visit: Payer: Self-pay

## 2021-08-09 DIAGNOSIS — Z1231 Encounter for screening mammogram for malignant neoplasm of breast: Secondary | ICD-10-CM

## 2022-05-01 ENCOUNTER — Other Ambulatory Visit: Payer: Self-pay | Admitting: Obstetrics and Gynecology

## 2022-05-01 DIAGNOSIS — Z1231 Encounter for screening mammogram for malignant neoplasm of breast: Secondary | ICD-10-CM

## 2022-08-12 ENCOUNTER — Ambulatory Visit: Payer: Managed Care, Other (non HMO)

## 2022-08-21 ENCOUNTER — Encounter: Payer: Self-pay | Admitting: Gastroenterology

## 2022-08-26 ENCOUNTER — Ambulatory Visit
Admission: RE | Admit: 2022-08-26 | Discharge: 2022-08-26 | Disposition: A | Discharge status: Home / Self Care | Payer: BC Managed Care – PPO | Source: Ambulatory Visit | Attending: Obstetrics and Gynecology | Admitting: Obstetrics and Gynecology

## 2022-08-26 DIAGNOSIS — Z1231 Encounter for screening mammogram for malignant neoplasm of breast: Secondary | ICD-10-CM

## 2022-09-20 ENCOUNTER — Ambulatory Visit (AMBULATORY_SURGERY_CENTER): Payer: BC Managed Care – PPO

## 2022-09-20 VITALS — Ht 63.0 in | Wt 129.0 lb

## 2022-09-20 DIAGNOSIS — Z1211 Encounter for screening for malignant neoplasm of colon: Secondary | ICD-10-CM

## 2022-09-20 MED ORDER — NA SULFATE-K SULFATE-MG SULF 17.5-3.13-1.6 GM/177ML PO SOLN: 1.0000 | Freq: Once | ORAL | 0 refills | Status: AC

## 2022-09-20 NOTE — Progress Notes (Signed)
No egg or soy allergy known to patient   No issues known to pt with past sedation with any surgeries or procedures  Patient denies ever being told they had issues or difficulty with intubation   No FH of Malignant Hyperthermia  Pt is not on diet pills  Pt is not on  home 02   Pt is not on blood thinners   Pt denies issues with constipation   No A fib or A flutter  Have any cardiac testing pending--no  Virtual previsit  Pt instructed to use Singlecare.com or GoodRx for a price reduction on prep

## 2022-10-10 ENCOUNTER — Encounter: Payer: Self-pay | Admitting: Gastroenterology

## 2022-10-20 ENCOUNTER — Encounter: Payer: Self-pay | Admitting: Certified Registered Nurse Anesthetist

## 2022-10-21 ENCOUNTER — Encounter: Payer: Self-pay | Admitting: Gastroenterology

## 2022-10-21 ENCOUNTER — Ambulatory Visit: Payer: BC Managed Care – PPO | Admitting: Gastroenterology

## 2022-10-21 VITALS — BP 120/74 | HR 68 | Temp 98.1°F | Resp 15 | Ht 63.0 in | Wt 129.0 lb

## 2022-10-21 DIAGNOSIS — Z1211 Encounter for screening for malignant neoplasm of colon: Secondary | ICD-10-CM

## 2022-10-21 MED ORDER — SODIUM CHLORIDE 0.9 % IV SOLN: 500.0000 mL | Freq: Once | INTRAVENOUS | Status: DC

## 2022-10-21 NOTE — Progress Notes (Signed)
Referring Provider: Myrlene Broker, MD Primary Care Physician:  Myrlene Broker, MD   Indication for Colonoscopy:  Colon cancer screening   IMPRESSION:  Need for colon cancer screening Appropriate candidate for monitored anesthesia care  PLAN: Colonoscopy in the [facility] today   HPI: Amanda Bush is a 52 y.o. female presents for screening colonoscopy.  No prior colonoscopy or colon cancer screening.  No known family history of colon cancer or polyps. No family history of uterine/endometrial cancer, pancreatic cancer or gastric/stomach cancer.   Past Medical History:  Diagnosis Date   Allergy    seasonal   Anxiety    Hyperlipidemia    Medical history non-contributory     Past Surgical History:  Procedure Laterality Date   BREAST BIOPSY Left    CESAREAN SECTION  2010   CESAREAN SECTION WITH BILATERAL TUBAL LIGATION Bilateral 02/11/2013   Procedure: REPEAT CESAREAN SECTION WITH BILATERAL TUBAL LIGATION;  Surgeon: Luz Lex, MD;  Location: [facility];  Service: Obstetrics;  Laterality: Bilateral;   DILATION AND CURETTAGE OF UTERUS     due to SABs   TUBAL LIGATION Bilateral 2014   WRIST GANGLION EXCISION Left 2008    Current Outpatient Medications  Medication Sig Dispense Refill   escitalopram (LEXAPRO) 5 MG tablet Take 1 tablet by mouth daily.     hydrocortisone (ANUSOL-HC) 2.5 % rectal cream Apply topically as directed.     ibuprofen (ADVIL) 800 MG tablet Take 800 mg by mouth 3 (three) times daily.     pravastatin (PRAVACHOL) 20 MG tablet Take 20 mg by mouth daily.     TURMERIC CURCUMIN PO Take by mouth.     ASHWAGANDHA PO Take by mouth.     EPINEPHrine 0.3 mg/0.3 mL IJ SOAJ injection epinephrine 0.3 mg/0.3 mL injection, auto-injector  Inject 0.3 mLs (0.3 mg total) into the muscle once as needed for up to 1 dose for Anaphylaxis.     fluticasone (FLONASE) 50 MCG/ACT nasal spray Place into the nose.     influenza vac split quadrivalent PF (FLULAVAL  QUADRIVALENT) 0.5 ML injection Flulaval Quad 2020-2021 (PF) 60 mcg (15 mcg x 4)/0.5 mL IM syringe  INJECT 0.5 ML INTO THE MUSCLE ONCE     ipratropium (ATROVENT) 0.03 % nasal spray TAKE 2 SPRAY(S) INTRANASALLY 3 TIMES A DAY 30 DAYS     levocetirizine (XYZAL) 5 MG tablet Take by mouth daily as needed.     meloxicam (MOBIC) 15 MG tablet Take 1 tablet (15 mg total) by mouth daily. 30 tablet 0   Multiple Vitamin (MULTIVITAMIN ADULT PO) Take by mouth.     oxybutynin (DITROPAN-XL) 5 MG 24 hr tablet daily as needed.     predniSONE (STERAPRED UNI-PAK 21 TAB) 10 MG (21) TBPK tablet Take as directed 21 tablet 0   No current facility-administered medications for this visit.    Allergies as of 10/21/2022 - Review Complete 10/21/2022  Allergen Reaction Noted   Cefdinir Diarrhea 06/16/2017    Family History  Problem Relation Age of Onset   Stroke Maternal Grandfather    Breast cancer Neg Hx    Colon cancer Neg Hx    Colon polyps Neg Hx    Esophageal cancer Neg Hx    Rectal cancer Neg Hx    Stomach cancer Neg Hx      Physical Exam: General:   Alert,  well-nourished, pleasant and cooperative in NAD Head:  Normocephalic and atraumatic. Eyes:  Sclera clear, no icterus.   Conjunctiva  pink. Mouth:  No deformity or lesions.   Neck:  Supple; no masses or thyromegaly. Lungs:  Clear throughout to auscultation.   No wheezes. Heart:  Regular rate and rhythm; no murmurs. Abdomen:  Soft, non-tender, nondistended, normal bowel sounds, no rebound or guarding.  Msk:  Symmetrical. No boney deformities LAD: No inguinal or umbilical LAD Extremities:  No clubbing or edema. Neurologic:  Alert and  oriented x4;  grossly nonfocal Skin:  No obvious rash or bruise. Psych:  Alert and cooperative. Normal mood and affect.     Studies/Results: No results found.    Walterine Amodei L. Tarri Glenn, MD, MPH 10/21/2022, 8:56 AM

## 2022-10-21 NOTE — Patient Instructions (Signed)
Discharge instructions given. Handouts on Diverticulosis and Hemorrhoids. Resume previous medications. YOU HAD AN ENDOSCOPIC PROCEDURE TODAY AT THE [facility]:   Refer to the procedure report that was given to you for any specific questions about what was found during the examination.  If the procedure report does not answer your questions, please call your gastroenterologist to clarify.  If you requested that your care partner not be given the details of your procedure findings, then the procedure report has been included in a sealed envelope for you to review at your convenience later.  YOU SHOULD EXPECT: Some feelings of bloating in the abdomen. Passage of more gas than usual.  Walking can help get rid of the air that was put into your GI tract during the procedure and reduce the bloating. If you had a lower endoscopy (such as a colonoscopy or flexible sigmoidoscopy) you may notice spotting of blood in your stool or on the toilet paper. If you underwent a bowel prep for your procedure, you may not have a normal bowel movement for a few days.  Please Note:  You might notice some irritation and congestion in your nose or some drainage.  This is from the oxygen used during your procedure.  There is no need for concern and it should clear up in a day or so.  SYMPTOMS TO REPORT IMMEDIATELY:  Following lower endoscopy (colonoscopy or flexible sigmoidoscopy):  Excessive amounts of blood in the stool  Significant tenderness or worsening of abdominal pains  Swelling of the abdomen that is new, acute  Fever of 100F or higher  For urgent or emergent issues, a gastroenterologist can be reached at any hour by calling [phone]. Do not use MyChart messaging for urgent concerns.    DIET:  We do recommend a small meal at first, but then you may proceed to your regular diet.  Drink plenty of fluids but you should avoid alcoholic beverages for 24 hours.  ACTIVITY:  You should plan to take  it easy for the rest of today and you should NOT DRIVE or use heavy machinery until tomorrow (because of the sedation medicines used during the test).    FOLLOW UP: Our staff will call the number listed on your records the next business day following your procedure.  We will call around 7:15- 8:00 am to check on you and address any questions or concerns that you may have regarding the information given to you following your procedure. If we do not reach you, we will leave a message.     If any biopsies were taken you will be contacted by phone or by letter within the next 1-3 weeks.  Please call us at [phone] if you have not heard about the biopsies in 3 weeks.    SIGNATURES/CONFIDENTIALITY: You and/or your care partner have signed paperwork which will be entered into your electronic medical record.  These signatures attest to the fact that that the information above on your After Visit Summary has been reviewed and is understood.  Full responsibility of the confidentiality of this discharge information lies with you and/or your care-partner.  

## 2022-10-21 NOTE — Op Note (Signed)
[facility] Patient Name: Amanda Bush Procedure Date: 10/21/2022 9:20 AM MRN: [id number] Endoscopist: Thornton Park MD, MD, [id number] Age: 52 Referring MD:  Date of Birth: [birth year] Gender: Female Account #: [id number] Procedure:                Colonoscopy Indications:              Screening for colorectal malignant neoplasm, This                            is the patient's first colonoscopy                           No known family history of colon cancer or polyps Medicines:                Monitored Anesthesia Care Procedure:                Pre-Anesthesia Assessment:                           - Prior to the procedure, a History and Physical                            was performed, and patient medications and                            allergies were reviewed. The patient's tolerance of                            previous anesthesia was also reviewed. The risks                            and benefits of the procedure and the sedation                            options and risks were discussed with the patient.                            All questions were answered, and informed consent                            was obtained. Prior Anticoagulants: The patient has                            taken no anticoagulant or antiplatelet agents. ASA                            Grade Assessment: II - A patient with mild systemic                            disease. After reviewing the risks and benefits,                            the patient was deemed in satisfactory condition to  undergo the procedure.                           After obtaining informed consent, the colonoscope                            was passed under direct vision. Throughout the                            procedure, the patient's blood pressure, pulse, and                            oxygen saturations were monitored continuously. The                            CF [id number]  was introduced through the anus                            and advanced to the 3 cm into the ileum. A second                            forward view of the right colon was performed. The                            colonoscopy was performed without difficulty. The                            patient tolerated the procedure well. The quality                            of the bowel preparation was good. The terminal                            ileum, ileocecal valve, appendiceal orifice, and                            rectum were photographed. Scope In: 9:33:07 AM Scope Out: 9:45:06 AM Scope Withdrawal Time: 0 hours 10 minutes 11 seconds  Total Procedure Duration: 0 hours 11 minutes 59 seconds  Findings:                 The perianal and digital rectal examinations were                            normal.                           A few medium-mouthed and small-mouthed diverticula                            were found in the sigmoid colon.                           Non-bleeding internal hemorrhoids were found.  The exam was otherwise without abnormality on                            direct and retroflexion views. Complications:            No immediate complications. Estimated Blood Loss:     Estimated blood loss was minimal. Impression:               - Diverticulosis in the sigmoid colon.                           - Non-bleeding internal hemorrhoids.                           - The examination was otherwise normal on direct                            and retroflexion views.                           - No specimens collected. Recommendation:           - Patient has a contact number available for                            emergencies. The signs and symptoms of potential                            delayed complications were discussed with the                            patient. Return to normal activities tomorrow.                            Written discharge  instructions were provided to the                            patient.                           - Resume previous diet.                           - Continue present medications.                           - Repeat colonoscopy in 10 years for surveillance,                            earlier with new symptoms.                           - Follow a high fiber diet. Drink at least 64                            ounces of water daily. Add a daily stool bulking  agent such as psyllium (an exampled would be                            Metamucil).                           - Emerging evidence supports eating a diet of                            fruits, vegetables, grains, calcium, and yogurt                            while reducing red meat and alcohol may reduce the                            risk of colon cancer.                           - Thank you for allowing me to be involved in your                            colon cancer prevention. Thornton Park MD, MD 10/21/2022 9:48:02 AM This report has been signed electronically.

## 2022-10-21 NOTE — Progress Notes (Signed)
Report given to PACU, vss 

## 2022-10-22 ENCOUNTER — Telehealth: Payer: Self-pay | Admitting: *Deleted

## 2022-10-22 NOTE — Telephone Encounter (Signed)
  Follow up Call-     10/21/2022    8:48 AM 10/21/2022    8:46 AM  Call back number  Post procedure Call Back phone  # [phone]   Permission to leave phone message  Yes     Patient questions:  Do you have a fever, pain , or abdominal swelling? No. Pain Score  0 *  Have you tolerated food without any problems? Yes.    Have you been able to return to your normal activities? Yes.    Do you have any questions about your discharge instructions: Diet   No. Medications  No. Follow up visit  No.  Do you have questions or concerns about your Care? No.  Actions: * If pain score is 4 or above: No action needed, pain <4.

## 2023-05-05 ENCOUNTER — Other Ambulatory Visit: Payer: Self-pay | Admitting: Obstetrics and Gynecology

## 2023-05-05 DIAGNOSIS — Z1231 Encounter for screening mammogram for malignant neoplasm of breast: Secondary | ICD-10-CM

## 2023-09-17 ENCOUNTER — Ambulatory Visit
Admission: RE | Admit: 2023-09-17 | Discharge: 2023-09-17 | Disposition: A | Discharge status: Home / Self Care | Payer: BC Managed Care – PPO | Source: Ambulatory Visit | Attending: Obstetrics and Gynecology | Admitting: Obstetrics and Gynecology

## 2023-09-17 DIAGNOSIS — Z1231 Encounter for screening mammogram for malignant neoplasm of breast: Secondary | ICD-10-CM

## 2024-08-18 ENCOUNTER — Other Ambulatory Visit: Payer: Self-pay | Admitting: Obstetrics and Gynecology

## 2024-08-18 DIAGNOSIS — Z1231 Encounter for screening mammogram for malignant neoplasm of breast: Secondary | ICD-10-CM

## 2024-09-24 ENCOUNTER — Ambulatory Visit
Admission: RE | Admit: 2024-09-24 | Discharge: 2024-09-24 | Disposition: A | Discharge status: Home / Self Care | Source: Ambulatory Visit | Attending: Obstetrics and Gynecology | Admitting: Obstetrics and Gynecology

## 2024-09-24 DIAGNOSIS — Z1231 Encounter for screening mammogram for malignant neoplasm of breast: Secondary | ICD-10-CM
